# Patient Record
Sex: Female | Born: 1975 | Race: Black or African American | Hispanic: No | Marital: Married | Smoking: Never smoker
Health system: Southern US, Community
[De-identification: ages and names within clinical notes are randomized; demographics above are authoritative.]

## PROBLEM LIST (undated history)

## (undated) DIAGNOSIS — F419 Anxiety disorder, unspecified: Secondary | ICD-10-CM

## (undated) DIAGNOSIS — D219 Benign neoplasm of connective and other soft tissue, unspecified: Secondary | ICD-10-CM

## (undated) DIAGNOSIS — I10 Essential (primary) hypertension: Secondary | ICD-10-CM

## (undated) DIAGNOSIS — E7800 Pure hypercholesterolemia, unspecified: Secondary | ICD-10-CM

## (undated) DIAGNOSIS — F32A Depression, unspecified: Secondary | ICD-10-CM

## (undated) DIAGNOSIS — F329 Major depressive disorder, single episode, unspecified: Secondary | ICD-10-CM

## (undated) HISTORY — DX: Anxiety disorder, unspecified: F41.9

## (undated) HISTORY — DX: Pure hypercholesterolemia, unspecified: E78.00

## (undated) HISTORY — DX: Major depressive disorder, single episode, unspecified: F32.9

## (undated) HISTORY — DX: Depression, unspecified: F32.A

## (undated) HISTORY — PX: WISDOM TOOTH EXTRACTION: SHX21

## (undated) HISTORY — DX: Benign neoplasm of connective and other soft tissue, unspecified: D21.9

## (undated) HISTORY — DX: Essential (primary) hypertension: I10

---

## 1999-10-23 ENCOUNTER — Other Ambulatory Visit: Admission: RE | Admit: 1999-10-23 | Discharge: 1999-10-23 | Payer: Self-pay | Admitting: Gynecology

## 1999-11-21 ENCOUNTER — Other Ambulatory Visit: Admission: RE | Admit: 1999-11-21 | Discharge: 1999-11-21 | Payer: Self-pay | Admitting: Gynecology

## 2000-06-20 ENCOUNTER — Other Ambulatory Visit: Admission: RE | Admit: 2000-06-20 | Discharge: 2000-06-20 | Payer: Self-pay | Admitting: Gynecology

## 2000-11-05 ENCOUNTER — Other Ambulatory Visit: Admission: RE | Admit: 2000-11-05 | Discharge: 2000-11-05 | Payer: Self-pay | Admitting: Gynecology

## 2000-12-02 ENCOUNTER — Other Ambulatory Visit: Admission: RE | Admit: 2000-12-02 | Discharge: 2000-12-02 | Payer: Self-pay | Admitting: Gynecology

## 2001-07-03 ENCOUNTER — Encounter: Admission: RE | Admit: 2001-07-03 | Discharge: 2001-07-03 | Payer: Self-pay | Admitting: Family Medicine

## 2001-07-03 ENCOUNTER — Encounter: Payer: Self-pay | Admitting: Family Medicine

## 2001-07-08 ENCOUNTER — Encounter: Payer: Self-pay | Admitting: Family Medicine

## 2001-07-08 ENCOUNTER — Encounter: Admission: RE | Admit: 2001-07-08 | Discharge: 2001-07-08 | Payer: Self-pay | Admitting: Family Medicine

## 2001-08-08 ENCOUNTER — Encounter: Payer: Self-pay | Admitting: Urology

## 2001-08-08 ENCOUNTER — Encounter: Admission: RE | Admit: 2001-08-08 | Discharge: 2001-08-08 | Payer: Self-pay | Admitting: Urology

## 2001-11-27 ENCOUNTER — Other Ambulatory Visit: Admission: RE | Admit: 2001-11-27 | Discharge: 2001-11-27 | Payer: Self-pay | Admitting: Gynecology

## 2002-08-05 ENCOUNTER — Encounter: Payer: Self-pay | Admitting: Emergency Medicine

## 2002-08-05 ENCOUNTER — Emergency Department (HOSPITAL_COMMUNITY): Admission: EM | Admit: 2002-08-05 | Discharge: 2002-08-05 | Payer: Self-pay | Admitting: Emergency Medicine

## 2002-10-16 ENCOUNTER — Other Ambulatory Visit: Admission: RE | Admit: 2002-10-16 | Discharge: 2002-10-16 | Payer: Self-pay | Admitting: Gynecology

## 2004-07-05 ENCOUNTER — Emergency Department (HOSPITAL_COMMUNITY): Admission: EM | Admit: 2004-07-05 | Discharge: 2004-07-06 | Payer: Self-pay | Admitting: Emergency Medicine

## 2004-10-02 ENCOUNTER — Other Ambulatory Visit: Admission: RE | Admit: 2004-10-02 | Discharge: 2004-10-02 | Payer: Self-pay | Admitting: Gynecology

## 2007-09-25 ENCOUNTER — Other Ambulatory Visit: Admission: RE | Admit: 2007-09-25 | Discharge: 2007-09-25 | Payer: Self-pay | Admitting: Gynecology

## 2008-10-05 ENCOUNTER — Encounter: Payer: Self-pay | Admitting: Gynecology

## 2008-10-05 ENCOUNTER — Other Ambulatory Visit: Admission: RE | Admit: 2008-10-05 | Discharge: 2008-10-05 | Payer: Self-pay | Admitting: Gynecology

## 2008-10-05 ENCOUNTER — Ambulatory Visit: Payer: Self-pay | Admitting: Gynecology

## 2009-10-06 ENCOUNTER — Encounter: Payer: Self-pay | Admitting: Gynecology

## 2009-10-06 ENCOUNTER — Other Ambulatory Visit: Admission: RE | Admit: 2009-10-06 | Discharge: 2009-10-06 | Payer: Self-pay | Admitting: Gynecology

## 2009-10-06 ENCOUNTER — Ambulatory Visit: Payer: Self-pay | Admitting: Gynecology

## 2010-02-06 ENCOUNTER — Ambulatory Visit: Payer: Self-pay | Admitting: Gynecology

## 2010-02-17 ENCOUNTER — Ambulatory Visit: Payer: Self-pay | Admitting: Gynecology

## 2010-09-29 ENCOUNTER — Inpatient Hospital Stay (HOSPITAL_COMMUNITY): Admission: AD | Admit: 2010-09-29 | Discharge: 2010-09-30 | Payer: Self-pay | Admitting: Obstetrics and Gynecology

## 2010-10-10 ENCOUNTER — Inpatient Hospital Stay (HOSPITAL_COMMUNITY): Admission: AD | Admit: 2010-10-10 | Discharge: 2010-10-10 | Payer: Self-pay | Admitting: Obstetrics and Gynecology

## 2010-10-11 ENCOUNTER — Inpatient Hospital Stay (HOSPITAL_COMMUNITY): Admission: AD | Admit: 2010-10-11 | Discharge: 2010-10-13 | Payer: Self-pay | Admitting: Obstetrics and Gynecology

## 2010-10-13 ENCOUNTER — Encounter
Admission: RE | Admit: 2010-10-13 | Discharge: 2010-11-12 | Payer: Self-pay | Source: Home / Self Care | Admitting: Obstetrics and Gynecology

## 2010-11-13 ENCOUNTER — Encounter
Admission: RE | Admit: 2010-11-13 | Discharge: 2010-11-23 | Payer: Self-pay | Source: Home / Self Care | Attending: Obstetrics and Gynecology | Admitting: Obstetrics and Gynecology

## 2011-02-28 LAB — CBC
Hemoglobin: 10.3 g/dL — ABNORMAL LOW (ref 12.0–15.0)
Hemoglobin: 12 g/dL (ref 12.0–15.0)
MCH: 31.1 pg (ref 26.0–34.0)
MCHC: 34.3 g/dL (ref 30.0–36.0)
MCHC: 34.4 g/dL (ref 30.0–36.0)
MCV: 90.6 fL (ref 78.0–100.0)
RBC: 3.3 MIL/uL — ABNORMAL LOW (ref 3.87–5.11)
RDW: 14 % (ref 11.5–15.5)
WBC: 15.2 10*3/uL — ABNORMAL HIGH (ref 4.0–10.5)
WBC: 16 10*3/uL — ABNORMAL HIGH (ref 4.0–10.5)

## 2011-02-28 LAB — RPR: RPR Ser Ql: NONREACTIVE

## 2012-02-13 ENCOUNTER — Encounter (HOSPITAL_COMMUNITY): Payer: Self-pay | Admitting: Cardiology

## 2012-02-13 ENCOUNTER — Emergency Department (INDEPENDENT_AMBULATORY_CARE_PROVIDER_SITE_OTHER)
Admission: EM | Admit: 2012-02-13 | Discharge: 2012-02-13 | Disposition: A | Discharge status: Home Health | Payer: 59 | Source: Home / Self Care | Attending: Family Medicine | Admitting: Family Medicine

## 2012-02-13 DIAGNOSIS — H109 Unspecified conjunctivitis: Secondary | ICD-10-CM

## 2012-02-13 DIAGNOSIS — J069 Acute upper respiratory infection, unspecified: Secondary | ICD-10-CM

## 2012-02-13 MED ORDER — POLYMYXIN B-TRIMETHOPRIM 10000-0.1 UNIT/ML-% OP SOLN: 1.0000 [drp] | Freq: Four times a day (QID) | OPHTHALMIC | Status: AC

## 2012-02-13 NOTE — ED Provider Notes (Signed)
Tammy Escobar is a 36 y.o. female who presents to Urgent Care today for right eye conjunctivitis starting today. Patient has cough nasal congestion and rhinorrhea for the past 4 days. She denies any trouble breathing fevers or chills. She otherwise feels well. She has a 55-month-old daughter at home who is currently well.   PMH reviewed. Healthy young woman ROS as above otherwise neg Medications reviewed. No current facility-administered medications for this encounter.   Current Outpatient Prescriptions  Medication Sig Dispense Refill  . trimethoprim-polymyxin b (POLYTRIM) ophthalmic solution Place 1 drop into the right eye every 6 (six) hours.  10 mL  0    Exam:  BP 128/88  Pulse 96  Temp(Src) 98.9 F (37.2 C) (Oral)  Resp 18  SpO2 100% Gen: Well NAD HEENT: EOMI,  MMM, right eye conjunctival injection. Normal tympanic membranes bilaterally. Posterior pharynx is erythematous without exudate Lungs: CTABL Nl WOB Heart: RRR no MRG Exts: Non edematous BL  LE, warm and well perfused.   Assessment and Plan: 36 year old woman with a viral URI with conjunctivitis. Plan to treat URI symptomatically with Tylenol. Conjunctivitis is likely also a viral, however we'll provide Polytrim eyedrops if it does not improve or develops significant discharge. I discussed warning signs such as but not limited to trouble with vision, eye pain, trouble breathing, chest pain, high fever with patient who expresses understanding.       Clementeen Graham, MD 02/13/12 [phone]

## 2012-02-13 NOTE — ED Notes (Signed)
Pt reports nasal congestion and redness to right eye that started earlier today. Pt states fever today no number to report but felt hot to touch and then became sweaty

## 2012-02-13 NOTE — Discharge Instructions (Signed)
Thank you for coming in today. You have a cold.  Use the eye drops if your eye does not get better in a few days or has discharge.  Look out for worsening vision, eye pain, trouble breathing or high fever. Go to the Emergency Room if you have those symptoms.   Conjunctivitis Conjunctivitis is commonly called "pink eye." Conjunctivitis can be caused by bacterial or viral infection, allergies, or injuries. There is usually redness of the lining of the eye, itching, discomfort, and sometimes discharge. There may be deposits of matter along the eyelids. A viral infection usually causes a watery discharge, while a bacterial infection causes a yellowish, thick discharge. Pink eye is very contagious and spreads by direct contact. You may be given antibiotic eyedrops as part of your treatment. Before using your eye medicine, remove all drainage from the eye by washing gently with warm water and cotton balls. Continue to use the medication until you have awakened 2 mornings in a row without discharge from the eye. Do not rub your eye. This increases the irritation and helps spread infection. Use separate towels from other household members. Wash your hands with soap and water before and after touching your eyes. Use cold compresses to reduce pain and sunglasses to relieve irritation from light. Do not wear contact lenses or wear eye makeup until the infection is gone. SEEK MEDICAL CARE IF:   Your symptoms are not better after 3 days of treatment.   You have increased pain or trouble seeing.   The outer eyelids become very red or swollen.  Document Released: 01/10/2005 Document Revised: 08/15/2011 Document Reviewed: 12/03/2005 [facility] Patient Information 2012 [place], [place].

## 2012-02-15 NOTE — ED Provider Notes (Signed)
Medical screening examination/treatment/procedure(s) were performed by resident physician or non-physician practitioner and as supervising physician I was immediately available for consultation/collaboration.   Declyn Offield DOUGLAS MD.    Siera Beyersdorf Douglas Dayon Witt, MD 02/15/12 1621 

## 2012-05-30 ENCOUNTER — Encounter: Payer: Self-pay | Admitting: *Deleted

## 2012-06-02 ENCOUNTER — Encounter: Payer: 59 | Admitting: Gynecology

## 2012-06-05 ENCOUNTER — Encounter: Payer: Self-pay | Admitting: Gynecology

## 2012-06-05 ENCOUNTER — Ambulatory Visit (INDEPENDENT_AMBULATORY_CARE_PROVIDER_SITE_OTHER): Payer: 59 | Admitting: Gynecology

## 2012-06-05 ENCOUNTER — Other Ambulatory Visit (HOSPITAL_COMMUNITY)
Admission: RE | Admit: 2012-06-05 | Discharge: 2012-06-05 | Disposition: A | Discharge status: Home / Self Care | Payer: 59 | Source: Ambulatory Visit | Attending: Gynecology | Admitting: Gynecology

## 2012-06-05 VITALS — BP 124/82 | Ht 64.75 in | Wt 179.0 lb

## 2012-06-05 DIAGNOSIS — D259 Leiomyoma of uterus, unspecified: Secondary | ICD-10-CM

## 2012-06-05 DIAGNOSIS — Z01419 Encounter for gynecological examination (general) (routine) without abnormal findings: Secondary | ICD-10-CM

## 2012-06-05 DIAGNOSIS — N920 Excessive and frequent menstruation with regular cycle: Secondary | ICD-10-CM

## 2012-06-05 DIAGNOSIS — Z131 Encounter for screening for diabetes mellitus: Secondary | ICD-10-CM

## 2012-06-05 DIAGNOSIS — Z13220 Encounter for screening for lipoid disorders: Secondary | ICD-10-CM

## 2012-06-05 DIAGNOSIS — Z1159 Encounter for screening for other viral diseases: Secondary | ICD-10-CM | POA: Insufficient documentation

## 2012-06-05 LAB — CBC WITH DIFFERENTIAL/PLATELET
Lymphs Abs: 2.2 10*3/uL (ref 0.7–4.0)
MCHC: 33.9 g/dL (ref 30.0–36.0)
Monocytes Relative: 5 % (ref 3–12)
Neutro Abs: 6.3 10*3/uL (ref 1.7–7.7)
Platelets: 382 10*3/uL (ref 150–400)
RBC: 4.45 MIL/uL (ref 3.87–5.11)
WBC: 9 10*3/uL (ref 4.0–10.5)

## 2012-06-05 LAB — LIPID PANEL: LDL Cholesterol: 91 mg/dL (ref 0–99)

## 2012-06-05 MED ORDER — NORETHINDRONE ACET-ETHINYL EST 1-20 MG-MCG PO TABS: 1.0000 | ORAL_TABLET | Freq: Every day | ORAL | Status: DC

## 2012-06-05 NOTE — Progress Notes (Signed)
Tammy Escobar 13-Aug-1976 [id number]        36 y.o.  for annual exam.  Several issues noted below.  Past medical history,surgical history, medications, allergies, family history and social history were all reviewed and documented in the EPIC chart. ROS:  Was performed and pertinent positives and negatives are included in the history.  Exam: Elane Fritz chaperone present Filed Vitals:   06/05/12 1138  BP: 124/82   General appearance  Normal Skin grossly normal Head/Neck normal with no cervical or supraclavicular adenopathy thyroid normal Lungs  clear Cardiac RR, without RMG Abdominal  soft, nontender, without masses, organomegaly or hernia Breasts  examined lying and sitting without masses, retractions, discharge or axillary adenopathy. Pelvic  Ext/BUS/vagina  normal   Cervix  normal Pap/HPV  Uterus  anteverted, normal size, shape and contour, midline and mobile nontender   Adnexa  Without masses or tenderness    Anus and perineum  normal   Rectovaginal  normal sphincter tone without palpated masses or tenderness.    Assessment/Plan:  36 y.o. female for annual exam.    1. Menorrhagia.  Underwent vaginal delivery 2011. Menses have resumed and are heavy monthly periods requiring double protection. 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 Exam overall is grossly normal. Recommended sonohysterogram rule out intracavitary myomas. Baseline CBC/TSH. Patient will follow up for these studies. 2. Contraception. Reviewed all options of contraception. Thinking of pregnancy in 1-2 years. Is interested in trying low-dose oral contraceptives. I reviewed risks benefits to include thrombosis, stroke heart attack DVT. She does not smoke and is not being followed for any medical issues and accepts the risks. Loestrin 120 equivalents prescribed x1 year.  Sunday start backup contraception. 3. Breast health. Screening mammographic recommendations between 35 and 40 were reviewed with the patient. She does have  grandmothers with breast cancer, postmenopausal.  Patient is comfortable waiting to 40 after her next pregnancy which I think is reasonable. SBE monthly reviewed. 4. Pap smear. Pap/HPV done. No history of abnormal Pap smears with last Pap smear 2011. Assuming negative will plan every 5 year Pap smear per current screening guidelines. 5. Health maintenance. Baseline CBC lipid profile glucose urinalysis ordered. Patient will follow up for her sonohysterogram and lab studies and we'll go from there.    Dara Lords MD, 12:04 PM 06/05/2012

## 2012-06-05 NOTE — Patient Instructions (Signed)
Follow up for ultrasound study as scheduled. Start on oral contraceptives with next menses, backup contraception with condoms at least first pack.

## 2012-06-06 LAB — URINALYSIS W MICROSCOPIC + REFLEX CULTURE
Bacteria, UA: NONE SEEN
Bilirubin Urine: NEGATIVE
Glucose, UA: NEGATIVE mg/dL
Leukocytes, UA: NEGATIVE
Nitrite: NEGATIVE
Protein, ur: NEGATIVE mg/dL
Urobilinogen, UA: 0.2 mg/dL (ref 0.0–1.0)
pH: 6 (ref 5.0–8.0)

## 2012-06-16 ENCOUNTER — Ambulatory Visit (INDEPENDENT_AMBULATORY_CARE_PROVIDER_SITE_OTHER): Payer: 59

## 2012-06-16 ENCOUNTER — Encounter: Payer: Self-pay | Admitting: Gynecology

## 2012-06-16 ENCOUNTER — Other Ambulatory Visit: Payer: Self-pay | Admitting: Gynecology

## 2012-06-16 ENCOUNTER — Ambulatory Visit (INDEPENDENT_AMBULATORY_CARE_PROVIDER_SITE_OTHER): Payer: 59 | Admitting: Gynecology

## 2012-06-16 DIAGNOSIS — D251 Intramural leiomyoma of uterus: Secondary | ICD-10-CM

## 2012-06-16 DIAGNOSIS — N8300 Follicular cyst of ovary, unspecified side: Secondary | ICD-10-CM

## 2012-06-16 DIAGNOSIS — D259 Leiomyoma of uterus, unspecified: Secondary | ICD-10-CM

## 2012-06-16 DIAGNOSIS — N920 Excessive and frequent menstruation with regular cycle: Secondary | ICD-10-CM

## 2012-06-16 NOTE — Patient Instructions (Signed)
Office will call you with biopsy results. Start on oral contraceptives as discussed. Call if your periods continue heavy.

## 2012-06-16 NOTE — Progress Notes (Signed)
Patient presents for sonohysterogram due to menorrhagia. Ultrasound shows several small myomas largest 16 mm. Endometrial echo 2.6 mm. Right and left ovaries grossly normal with follicles. Cul-de-sac negative. Sonohysterogram performed, sterile technique, easy catheter introduction, good distention, no abnormalities. Endometrial sample taken. Patient tolerated well.  Assessment and plan: Menorrhagia with negative sonohysterogram. TSH normal, hemoglobin 12.9. She is going to start on low-dose oral contraceptives as discussed at her annual exam and we'll see how she does. Alternatives to include Mirena IUD discussed. She is still interested in childbearing. Patient will follow up if her menorrhagia continues despite BCPs. She will also follow up for her biopsy results.

## 2012-12-16 ENCOUNTER — Emergency Department (HOSPITAL_COMMUNITY)
Admission: EM | Admit: 2012-12-16 | Discharge: 2012-12-16 | Disposition: A | Discharge status: Home / Self Care | Payer: 59 | Source: Home / Self Care

## 2012-12-16 ENCOUNTER — Encounter (HOSPITAL_COMMUNITY): Payer: Self-pay | Admitting: *Deleted

## 2012-12-16 DIAGNOSIS — M25559 Pain in unspecified hip: Secondary | ICD-10-CM

## 2012-12-16 DIAGNOSIS — M25551 Pain in right hip: Secondary | ICD-10-CM

## 2012-12-16 MED ORDER — TRAMADOL HCL 50 MG PO TABS: 50.0000 mg | ORAL_TABLET | Freq: Four times a day (QID) | ORAL | Status: DC | PRN

## 2012-12-16 MED ORDER — KETOROLAC TROMETHAMINE 60 MG/2ML IM SOLN
60.0000 mg | Freq: Once | INTRAMUSCULAR | Status: AC
  Administered 2012-12-16: 60 mg via INTRAMUSCULAR

## 2012-12-16 MED ORDER — KETOROLAC TROMETHAMINE 60 MG/2ML IM SOLN
INTRAMUSCULAR | Status: AC
  Filled 2012-12-16: qty 2

## 2012-12-16 MED ORDER — METHYLPREDNISOLONE 4 MG PO KIT: PACK | ORAL | Status: DC

## 2012-12-16 NOTE — ED Provider Notes (Signed)
History     CSN: [id number]  Arrival date & time 12/16/12  1624   None     Chief Complaint  Patient presents with  . Back Pain    (Consider location/radiation/quality/duration/timing/severity/associated sxs/prior treatment) HPI Comments: Residual female presents with pain in the right posterior hip it radiates to the right mid thigh approximately 3 weeks. His been no history of injury or trauma. Is nothing that makes it worse nothing makes it better. It may occur at any time such as when getting out of bed, when walking the act of sitting and doing sitting. She points to her upper posterior buttock as the source of pain and tenderness in the lateral thigh which radiates. She has been taking Tylenol and Advil without relief. She denies focal paresthesias or weakness.   Past Medical History  Diagnosis Date  . Leiomyoma     Past Surgical History  Procedure Date  . Wisdom tooth extraction   . Vaginal delivery 2011    Family History  Problem Relation Age of Onset  . Heart disease Father   . Hypertension Father   . Breast cancer Maternal Grandmother   . Cancer Maternal Grandmother     BLADDER  . Breast cancer Paternal Grandmother   . Hypertension Mother   . Diabetes Maternal Aunt   . Hypertension Maternal Aunt   . Diabetes Maternal Uncle   . Hypertension Maternal Uncle   . Diabetes Paternal Aunt   . Hypertension Paternal Aunt   . Diabetes Paternal Uncle   . Hypertension Paternal Uncle     History  Substance Use Topics  . Smoking status: Never Smoker   . Smokeless tobacco: Never Used  . Alcohol Use: Yes     Comment: social    OB History    Grav Para Term Preterm Abortions TAB SAB Ect Mult Living   1 1        1       Review of Systems  Constitutional: Negative for fever, chills and activity change.  HENT: Negative.   Respiratory: Negative.   Cardiovascular: Negative.   Gastrointestinal: Negative.   Musculoskeletal:       As per HPI  Skin: Negative for  color change, pallor and rash.  Neurological: Negative.     Allergies  Review of patient's allergies indicates no known allergies.  Home Medications   Current Outpatient Rx  Name  Route  Sig  Dispense  Refill  . METHYLPREDNISOLONE 4 MG PO KIT      follow package directions   21 tablet   0   . NORETHINDRONE ACET-ETHINYL EST 1-20 MG-MCG PO TABS   Oral   Take 1 tablet by mouth daily.   1 Package   11   . M-VIT PO TABS   Oral   Take 1 tablet by mouth daily.         . TRAMADOL HCL 50 MG PO TABS   Oral   Take 1 tablet (50 mg total) by mouth every 6 (six) hours as needed for pain.   15 tablet   0     BP 124/81  Pulse 90  Temp 99 F (37.2 C) (Oral)  Resp 18  Wt 175 lb (79.379 kg)  SpO2 97%  LMP 11/27/2012  Physical Exam  Nursing note and vitals reviewed. Constitutional: She is oriented to person, place, and time. She appears well-developed and well-nourished. No distress.  HENT:  Head: Normocephalic and atraumatic.  Neck: Normal range of motion. Neck supple.  Pulmonary/Chest: Effort normal.  Musculoskeletal:       Tenderness in the upper posterior buttock musculature but not in the median to lower buttock. No tenderness in the thigh where she describes the radiation of pain. Distal neurovascular motor sensory is grossly intact. Muscle tone is normal  Lymphadenopathy:    She has no cervical adenopathy.  Neurological: She is alert and oriented to person, place, and time. No cranial nerve deficit.  Skin: Skin is warm and dry.  Psychiatric: She has a normal mood and affect.    ED Course  Procedures (including critical care time)  Labs Reviewed - No data to display No results found.   1. Acute right hip pain       MDM  Toradol 60 mg IM now Tomorrow start Medrol Dosepak Tramadol as directed for pain Apply heat as directed  Stretches and exercises as noted in the instructions page. Any to followup with PCP if pain is not improving and consideration of  physical therapy to be helpful .        Hayden Rasmussen, NP 12/16/12 [phone]

## 2012-12-16 NOTE — ED Notes (Signed)
Pt reports left side back pain that radiates down buttocks and leg - with no known injury

## 2012-12-23 NOTE — ED Provider Notes (Signed)
Medical screening examination/treatment/procedure(s) were performed by resident physician or non-physician practitioner and as supervising physician I was immediately available for consultation/collaboration.   Enrico Eaddy DOUGLAS MD.    Lakia Gritton D Rishab Stoudt, MD 12/23/12 1350 

## 2013-06-09 ENCOUNTER — Encounter: Payer: Self-pay | Admitting: Gynecology

## 2013-06-24 ENCOUNTER — Ambulatory Visit (INDEPENDENT_AMBULATORY_CARE_PROVIDER_SITE_OTHER): Payer: BC Managed Care – PPO | Admitting: Gynecology

## 2013-06-24 ENCOUNTER — Encounter: Payer: Self-pay | Admitting: Gynecology

## 2013-06-24 VITALS — BP 128/84 | Ht 65.0 in | Wt 172.0 lb

## 2013-06-24 DIAGNOSIS — D219 Benign neoplasm of connective and other soft tissue, unspecified: Secondary | ICD-10-CM

## 2013-06-24 DIAGNOSIS — Z01419 Encounter for gynecological examination (general) (routine) without abnormal findings: Secondary | ICD-10-CM

## 2013-06-24 MED ORDER — NORETHINDRONE ACET-ETHINYL EST 1-20 MG-MCG PO TABS: 1.0000 | ORAL_TABLET | Freq: Every day | ORAL | Status: DC

## 2013-06-24 NOTE — Patient Instructions (Signed)
Follow up in one year, sooner as needed. 

## 2013-06-24 NOTE — Progress Notes (Signed)
Tammy Escobar [birth year] [id number]        37 y.o.  [name] for annual exam.  Doing well without complaints.  Past medical history,surgical history, medications, allergies, family history and social history were all reviewed and documented in the EPIC chart.  ROS:  Performed and pertinent positives and negatives are included in the history, assessment and plan .  Exam: Sherrilyn Rist assistant Filed Vitals:   06/24/13 1102  BP: 128/84  Height: 5\' 5"  (1.651 m)  Weight: 172 lb (78.019 kg)   General appearance  Normal Skin grossly normal Head/Neck normal with no cervical or supraclavicular adenopathy thyroid normal Lungs  clear Cardiac RR, without RMG Abdominal  soft, nontender, without masses, organomegaly or hernia Breasts  examined lying and sitting without masses, retractions, discharge or axillary adenopathy. Pelvic  Ext/BUS/vagina  normal   Cervix  normal   Uterus  anteverted, normal size, shape and contour, midline and mobile nontender   Adnexa  Without masses or tenderness    Anus and perineum  normal   Rectovaginal  normal sphincter tone without palpated masses or tenderness.    Assessment/Plan:  37 y.o. [name] female for annual exam, regular menses, birth control pill contraception.   1. Birth control pills. Patient doing well with regular lighter menses. Wants to continue and I refilled her times a year with Loestrin 120 equivalents. 2. Leiomyoma. History of small leiomyoma. Exam is normal. Continue to monitor with annual exams. 3. Pap smear/HPV 05/2012. No Pap smear done today. No history of significant abnormalities. Plan repeat a 5 year interval. 4. Breast health. SBE monthly reviewed. Screening mammographic recommendations between 35 and 40 discussed. Patient has a strong family history of the first week closer to 40. 5. Health maintenance. No lab work done as this is all done through her primary physician's office. Followup in one year, sooner as needed.  Note: This document  was prepared with digital dictation and possible smart phrase technology. Any transcriptional errors that result from this process are unintentional.   Dara Lords MD, 11:15 AM 06/24/2013

## 2013-10-16 ENCOUNTER — Ambulatory Visit (INDEPENDENT_AMBULATORY_CARE_PROVIDER_SITE_OTHER): Payer: BC Managed Care – PPO | Admitting: Physician Assistant

## 2013-10-16 VITALS — BP 108/60 | HR 80 | Temp 98.0°F | Resp 18 | Wt 180.0 lb

## 2013-10-16 DIAGNOSIS — Z111 Encounter for screening for respiratory tuberculosis: Secondary | ICD-10-CM

## 2013-10-16 NOTE — Progress Notes (Signed)
  Subjective:    Patient ID: Tammy Escobar, female    DOB: 24-Feb-1976, 37 y.o.   MRN: [id number]  HPI  Presents for TB screening.  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   Tuberculosis Risk Questionnaire  1. No Were you born outside the [place] in one of the following parts of the world: [place], [place], [place], [place] or [place]?    2. No Have you traveled outside the [place] and lived for more than one month in one of the following parts of the world: [place], [place], [place], [place] or [place]?    3. No Do you have a compromised immune system such as from any of the following conditions:HIV/AIDS, organ or bone marrow transplantation, diabetes, immunosuppressive medicines (e.g. Prednisone, Remicaide), leukemia, lymphoma, cancer of the head or neck, gastrectomy or jejunal bypass, end-stage renal disease (on dialysis), or silicosis?     4. Yes  Have you ever or do you plan on working in: a residential care center, a health care facility, a jail or prison or homeless shelter? Works in health care     5. No Have you ever: injected illegal drugs, used crack cocaine, lived in a homeless shelter  or been in jail or prison?     6. No Have you ever been exposed to anyone with infectious tuberculosis?    Tuberculosis Symptom Questionnaire  Do you currently have any of the following symptoms?  1. No Unexplained cough lasting more than 3 weeks?   2. No Unexplained fever lasting more than 3 weeks.   3. No Night Sweats (sweating that leaves the bedclothes and sheets wet)     4. No Shortness of Breath   5. No Chest Pain   6. No Unintentional weight loss    7. No Unexplained fatigue (very tired for no reason)    Review of Systems  see symptoms questionnaire below.    Objective:   Physical Exam BP 108/60  Pulse 80  Temp(Src) 98 F (36.7 C) (Oral)  Resp 18  Wt 180 lb (81.647 kg)  BMI  29.95 kg/m2  SpO2 100%  LMP 10/06/2013  WDWNBF, A&O x 3. Normal respiratory effort. Skin is warm and dry. Mood, affect and behavior are appropriate.     Assessment & Plan:  Screening-pulmonary TB - Plan: TB Skin Test  RTC 48-72 hours for PPD reading  Fernande Bras, PA-C Physician Assistant-Certified [facility]

## 2013-10-16 NOTE — Patient Instructions (Signed)
Do not apply a bandaid to the site.

## 2013-10-18 ENCOUNTER — Encounter (INDEPENDENT_AMBULATORY_CARE_PROVIDER_SITE_OTHER): Payer: BC Managed Care – PPO

## 2013-10-18 DIAGNOSIS — Z111 Encounter for screening for respiratory tuberculosis: Secondary | ICD-10-CM

## 2013-10-18 LAB — TB SKIN TEST
Induration: 0 mm
TB Skin Test: NEGATIVE

## 2013-11-13 ENCOUNTER — Ambulatory Visit (INDEPENDENT_AMBULATORY_CARE_PROVIDER_SITE_OTHER): Payer: BC Managed Care – PPO | Admitting: Emergency Medicine

## 2013-11-13 VITALS — BP 116/68 | HR 108 | Temp 99.3°F | Resp 17 | Ht 65.5 in | Wt 178.0 lb

## 2013-11-13 DIAGNOSIS — J209 Acute bronchitis, unspecified: Secondary | ICD-10-CM

## 2013-11-13 MED ORDER — ALBUTEROL SULFATE HFA 108 (90 BASE) MCG/ACT IN AERS: 2.0000 | INHALATION_SPRAY | RESPIRATORY_TRACT | Status: DC | PRN

## 2013-11-13 MED ORDER — HYDROCOD POLST-CHLORPHEN POLST 10-8 MG/5ML PO LQCR: 5.0000 mL | Freq: Two times a day (BID) | ORAL | Status: DC | PRN

## 2013-11-13 NOTE — Progress Notes (Signed)
[facility] [address], [place] [address] [phone]  Date:  11/13/2013   Name:  Tammy Escobar   DOB:  1976/11/11   MRN:  [id number]  PCP:  Altamese Bagdad, MD    Chief Complaint: Cough, URI and Nasal Congestion   History of Present Illness:  Tammy Escobar is a 36 y.o. very pleasant female patient who presents with the following:  1 week history nasal congestion, post nasal drainage and a cough that is not productive.  No wheezing or shortness of breath.  Had a fever of 100.9 on Monday.  Cough is persistent and unrelenting.  Worse at night.  No nausea or vomiting.  No improvement with over the counter medications or other home remedies. Denies other complaint or health concern today.   There are no active problems to display for this patient.   Past Medical History  Diagnosis Date  . Leiomyoma     Past Surgical History  Procedure Laterality Date  . Wisdom tooth extraction    . Vaginal delivery  2011    History  Substance Use Topics  . Smoking status: Never Smoker   . Smokeless tobacco: Never Used  . Alcohol Use: Yes     Comment: social    Family History  Problem Relation Age of Onset  . Heart disease Father   . Hypertension Father   . Cancer Father 44    prostate  . Breast cancer Maternal Grandmother   . Cancer Maternal Grandmother     BLADDER  . Breast cancer Paternal Grandmother   . Hypertension Mother   . Diabetes Maternal Aunt   . Hypertension Maternal Aunt   . Diabetes Maternal Uncle   . Hypertension Maternal Uncle   . Diabetes Paternal Aunt   . Hypertension Paternal Aunt   . Diabetes Paternal Uncle   . Hypertension Paternal Uncle     No Known Allergies  Medication list has been reviewed and updated.  Current Outpatient Prescriptions on File Prior to Visit  Medication Sig Dispense Refill  . Multiple Vitamins-Minerals (MULTIVITAMIN WITH MINERALS) tablet Take 1 tablet by mouth daily.       No current  facility-administered medications on file prior to visit.    Review of Systems:  As per HPI, otherwise negative.    Physical Examination: Filed Vitals:   11/13/13 1313  BP: 116/68  Pulse: 108  Temp: 99.3 F (37.4 C)  Resp: 17   Filed Vitals:   11/13/13 1313  Height: 5' 5.5" (1.664 m)  Weight: 178 lb (80.74 kg)   Body mass index is 29.16 kg/(m^2). Ideal Body Weight: Weight in (lb) to have BMI = 25: 152.2  GEN: WDWN, NAD, Non-toxic, A & O x 3 HEENT: Atraumatic, Normocephalic. Neck supple. No masses, No LAD. Ears and Nose: No external deformity. CV: RRR, No M/G/R. No JVD. No thrill. No extra heart sounds. PULM: CTA B,  Scattered post tussive wheezes,no  crackles, rhonchi. No retractions. No resp. distress. No accessory muscle use. ABD: S, NT, ND, +BS. No rebound. No HSM. EXTR: No c/c/e NEURO Normal gait.  PSYCH: Normally interactive. Conversant. Not depressed or anxious appearing.  Calm demeanor.    Assessment and Plan: Bronchitis with bronchospasm proair tussionex mucinex  Signed,  Phillips Odor, MD

## 2013-11-13 NOTE — Patient Instructions (Signed)
Metered Dose Inhaler (No Spacer Used) Inhaled medicines are the basis of asthma treatment and other breathing problems. Inhaled medicine can only be effective if used properly. Good technique assures that the medicine reaches the lungs. Metered dose inhalers (MDIs) are used to deliver a variety of inhaled medicines. These include quick relief or rescue medicines (such as bronchodilators) and controller medicines (such as corticosteroids). The medicine is delivered by pushing down on a metal canister to release a set amount of spray. If you are using different kinds of inhalers, use your quick relief medicine to open the airways 10 15 minutes before using a steroid if instructed to do so by your health care provider. If you are unsure which inhalers to use and the order of using them, ask your health care provider, nurse, or respiratory therapist. HOW TO USE THE INHALER 1. Remove cap from inhaler. 2. If you are using the inhaler for the first time, you will need to prime it. Shake the inhaler for 5 seconds and release four puffs into the air, away from your face. Ask your health care provider or pharmacist if you have questions about priming your inhaler. 3. Shake inhaler for 5 seconds before each breath in (inhalation). 4. Position the inhaler so that the top of the canister faces up. 5. Put your index finger on the top of the medicine canister. Your thumb supports the bottom of the inhaler. 6. Open your mouth. 7. Either place the inhaler between your teeth and place your lips tightly around the mouthpiece, or hold the inhaler 1 2 inches away from your open mouth. If you are unsure of which technique to use, ask your health care provider. 8. Breathe out (exhale) normally and as completely as possible. 9. Press the canister down with the index finger to release the medicine. 10. At the same time as the canister is pressed, inhale deeply and slowly until the lungs are completely filled. This should take  4 6 seconds. Keep your tongue down. 11. Hold the medicine in your lungs for up to 5 10 seconds (10 seconds is best). This helps the medicine get into the small airways of your lungs. 12. Breathe out slowly, through pursed lips. Whistling is an example of pursed lips. 13. Wait at least 1 minute between puffs. Continue with the above steps until you have taken the number of puffs your health care provider has ordered. Do not use the inhaler more than your health care provider directs you to. 14. Replace cap on inhaler. 15. Follow the directions from your health care provider or the inhaler insert for cleaning the inhaler. If you are using a steroid inhaler, rinse your mouth with water after your last puff, gargle, and spit out the water. Do not swallow the water. AVOID:  Inhaling before or after starting the spray of medicine. It takes practice to coordinate your breathing with triggering the spray.  Inhaling through the nose (rather than the mouth) when triggering the spray. HOW TO DETERMINE IF YOUR INHALER IS FULL OR NEARLY EMPTY You cannot know when an inhaler is empty by shaking it. A few inhalers are now being made with dose counters. Ask your health care provider for a prescription that has a dose counter if you feel you need that extra help. If your inhaler does not have a counter, ask your health care provider to help you determine the date you need to refill your inhaler. Write the refill date on a calendar or your inhaler canister.   Refill your inhaler 7 10 days before it runs out. Be sure to keep an adequate supply of medicine. This includes making sure it is not expired, and you have a spare inhaler.  SEEK MEDICAL CARE IF:   Symptoms are only partially relieved with your inhaler.  You are having trouble using your inhaler.  You experience some increase in phlegm. SEEK IMMEDIATE MEDICAL CARE IF:   You feel little or no relief with your inhalers. You are still wheezing and are feeling  shortness of breath or tightness in your chest or both.  You have dizziness, headaches, or fast heart rate.  You have chills, fever, or night sweats.  There is a noticeable increase in phlegm production, or there is blood in the phlegm. Document Released: 09/30/2007 Document Revised: 08/05/2013 Document Reviewed: 05/21/2013 [employer] Patient Information 2014 [employer].  

## 2014-07-05 ENCOUNTER — Ambulatory Visit (INDEPENDENT_AMBULATORY_CARE_PROVIDER_SITE_OTHER): Payer: 59 | Admitting: Gynecology

## 2014-07-05 ENCOUNTER — Encounter: Payer: Self-pay | Admitting: Gynecology

## 2014-07-05 VITALS — BP 118/72 | Ht 65.0 in | Wt 184.6 lb

## 2014-07-05 DIAGNOSIS — Z01419 Encounter for gynecological examination (general) (routine) without abnormal findings: Secondary | ICD-10-CM

## 2014-07-05 LAB — CBC WITH DIFFERENTIAL/PLATELET
BASOS ABS: 0 10*3/uL (ref 0.0–0.1)
BASOS PCT: 0 % (ref 0–1)
Eosinophils Absolute: 0.1 10*3/uL (ref 0.0–0.7)
Eosinophils Relative: 1 % (ref 0–5)
HEMATOCRIT: 37.2 % (ref 36.0–46.0)
Hemoglobin: 12.7 g/dL (ref 12.0–15.0)
Lymphocytes Relative: 18 % (ref 12–46)
Lymphs Abs: 1.1 10*3/uL (ref 0.7–4.0)
MCH: 28.5 pg (ref 26.0–34.0)
MCHC: 34.1 g/dL (ref 30.0–36.0)
MCV: 83.4 fL (ref 78.0–100.0)
MONO ABS: 0.3 10*3/uL (ref 0.1–1.0)
MONOS PCT: 5 % (ref 3–12)
NEUTROS ABS: 4.8 10*3/uL (ref 1.7–7.7)
NEUTROS PCT: 76 % (ref 43–77)
PLATELETS: 355 10*3/uL (ref 150–400)
RBC: 4.46 MIL/uL (ref 3.87–5.11)
RDW: 13.5 % (ref 11.5–15.5)
WBC: 6.3 10*3/uL (ref 4.0–10.5)

## 2014-07-05 MED ORDER — NORETHINDRONE ACET-ETHINYL EST 1-20 MG-MCG PO TABS: 1.0000 | ORAL_TABLET | Freq: Every day | ORAL | Status: DC

## 2014-07-05 NOTE — Progress Notes (Signed)
Tammy Escobar 1976/10/14 [id number]        38 y.o.  [name] for annual exam.  Several issues noted below.  Past medical history,surgical history, problem list, medications, allergies, family history and social history were all reviewed and documented as reviewed in the EPIC chart.  ROS:  12 system ROS performed with pertinent positives and negatives included in the history, assessment and plan.   Additional significant findings :  None   Exam: Journalist, newspaper Filed Vitals:   07/05/14 1520  BP: 118/72  Height: 5\' 5"  (1.651 m)  Weight: 184 lb 9.6 oz (83.734 kg)   General appearance:  Normal affect, orientation and appearance. Skin: Grossly normal HEENT: Without gross lesions.  No cervical or supraclavicular adenopathy. Thyroid normal.  Lungs:  Clear without wheezing, rales or rhonchi Cardiac: RR, without RMG Abdominal:  Soft, nontender, without masses, guarding, rebound, organomegaly or hernia Breasts:  Examined lying and sitting without masses, retractions, discharge or axillary adenopathy. Pelvic:  Ext/BUS/vagina normal  Cervix normal  Uterus anteverted, normal size, shape and contour, midline and mobile nontender   Adnexa  Without masses or tenderness    Anus and perineum  Normal   Rectovaginal  Normal sphincter tone without palpated masses or tenderness.    Assessment/Plan:  38 y.o. [name] female for annual exam with regular menses, oral contraceptives.   1. Contraceptive management. Patient on Loestrin 120 equivalent and doing well wants to continue. Risks to include increased risk of stroke heart attack DVT reviewed. Does not smoke and is not being followed for any medical issues. Understands accepts risks and I refilled her x1 year. 2. One day history of headache and nausea. No real vomiting. Able to take by mouth fluids. No diarrhea constipation or abdominal pain. No dysuria frequency urgency. No else in the family sick. No skipped menses. LMP 06/26/2014. We'll check  baseline labs for CBC comp and some metabolic panel urinalysis. Follow symptoms at present. Probably viral syndrome. Assuming results and follow. Treat with pushing fluids and OTC ibuprofen 600-800 mg. If symptoms continue will followup for further evaluation. 3. Breast health. Screening mammographic recommendations between 47 and 40 reviewed. No strong family history and at this point plans to wait till 44. SBE monthly reviewed. 4. Pap smear/HPV negative 2013. No Pap smear done today. No history of significant abnormal Pap smears. Current screening recommendations for five-year interval repeat reviewed. Patient's a little uncomfortable with this and would prefer repeating it next year at a 3 year interval. Will rediscuss on an annual basis. 5. Health maintenance. Baseline CBC comprehensive metabolic panel lipid profile urinalysis done. Followup in one year, sooner if above symptoms continue or worsen.   Note: This document was prepared with digital dictation and possible smart phrase technology. Any transcriptional errors that result from this process are unintentional.   Anastasio Auerbach MD, 3:49 PM 07/05/2014

## 2014-07-05 NOTE — Patient Instructions (Signed)
Call if your headaches or nausea continue. Followup in one year for your annual exam.  You may obtain a copy of any labs that were done today by logging onto MyChart as outlined in the instructions provided with your AVS (after visit summary). The office will not call with normal lab results but certainly if there are any significant abnormalities then we will contact you.   Health Maintenance, Female A healthy lifestyle and preventative care can promote health and wellness.  Maintain regular health, dental, and eye exams.  Eat a healthy diet. Foods like vegetables, fruits, whole grains, low-fat dairy products, and lean protein foods contain the nutrients you need without too many calories. Decrease your intake of foods high in solid fats, added sugars, and salt. Get information about a proper diet from your caregiver, if necessary.  Regular physical exercise is one of the most important things you can do for your health. Most adults should get at least 150 minutes of moderate-intensity exercise (any activity that increases your heart rate and causes you to sweat) each week. In addition, most adults need muscle-strengthening exercises on 2 or more days a week.   Maintain a healthy weight. The body mass index (BMI) is a screening tool to identify possible weight problems. It provides an estimate of body fat based on height and weight. Your caregiver can help determine your BMI, and can help you achieve or maintain a healthy weight. For adults 20 years and older:  A BMI below 18.5 is considered underweight.  A BMI of 18.5 to 24.9 is normal.  A BMI of 25 to 29.9 is considered overweight.  A BMI of 30 and above is considered obese.  Maintain normal blood lipids and cholesterol by exercising and minimizing your intake of saturated fat. Eat a balanced diet with plenty of fruits and vegetables. Blood tests for lipids and cholesterol should begin at age 29 and be repeated every 5 years. If your  lipid or cholesterol levels are high, you are over 50, or you are a high risk for heart disease, you may need your cholesterol levels checked more frequently.Ongoing high lipid and cholesterol levels should be treated with medicines if diet and exercise are not effective.  If you smoke, find out from your caregiver how to quit. If you do not use tobacco, do not start.  Lung cancer screening is recommended for adults aged 30 80 years who are at high risk for developing lung cancer because of a history of smoking. Yearly low-dose computed tomography (CT) is recommended for people who have at least a 30-pack-year history of smoking and are a current smoker or have quit within the past 15 years. A pack year of smoking is smoking an average of 1 pack of cigarettes a day for 1 year (for example: 1 pack a day for 30 years or 2 packs a day for 15 years). Yearly screening should continue until the smoker has stopped smoking for at least 15 years. Yearly screening should also be stopped for people who develop a health problem that would prevent them from having lung cancer treatment.  If you are pregnant, do not drink alcohol. If you are breastfeeding, be very cautious about drinking alcohol. If you are not pregnant and choose to drink alcohol, do not exceed 1 drink per day. One drink is considered to be 12 ounces (355 mL) of beer, 5 ounces (148 mL) of wine, or 1.5 ounces (44 mL) of liquor.  Avoid use of street drugs. Do  not share needles with anyone. Ask for help if you need support or instructions about stopping the use of drugs.  High blood pressure causes heart disease and increases the risk of stroke. Blood pressure should be checked at least every 1 to 2 years. Ongoing high blood pressure should be treated with medicines, if weight loss and exercise are not effective.  If you are 62 to 38 years old, ask your caregiver if you should take aspirin to prevent strokes.  Diabetes screening involves taking a  blood sample to check your fasting blood sugar level. This should be done once every 3 years, after age 68, if you are within normal weight and without risk factors for diabetes. Testing should be considered at a younger age or be carried out more frequently if you are overweight and have at least 1 risk factor for diabetes.  Breast cancer screening is essential preventative care for women. You should practice "breast self-awareness." This means understanding the normal appearance and feel of your breasts and may include breast self-examination. Any changes detected, no matter how small, should be reported to a caregiver. Women in their 79s and 30s should have a clinical breast exam (CBE) by a caregiver as part of a regular health exam every 1 to 3 years. After age 32, women should have a CBE every year. Starting at age 78, women should consider having a mammogram (breast X-ray) every year. Women who have a family history of breast cancer should talk to their caregiver about genetic screening. Women at a high risk of breast cancer should talk to their caregiver about having an MRI and a mammogram every year.  Breast cancer gene (BRCA)-related cancer risk assessment is recommended for women who have family members with BRCA-related cancers. BRCA-related cancers include breast, ovarian, tubal, and peritoneal cancers. Having family members with these cancers may be associated with an increased risk for harmful changes (mutations) in the breast cancer genes BRCA1 and BRCA2. Results of the assessment will determine the need for genetic counseling and BRCA1 and BRCA2 testing.  The Pap test is a screening test for cervical cancer. Women should have a Pap test starting at age 63. Between ages 17 and 63, Pap tests should be repeated every 2 years. Beginning at age 65, you should have a Pap test every 3 years as long as the past 3 Pap tests have been normal. If you had a hysterectomy for a problem that was not cancer or  a condition that could lead to cancer, then you no longer need Pap tests. If you are between ages 61 and 33, and you have had normal Pap tests going back 10 years, you no longer need Pap tests. If you have had past treatment for cervical cancer or a condition that could lead to cancer, you need Pap tests and screening for cancer for at least 20 years after your treatment. If Pap tests have been discontinued, risk factors (such as a new sexual partner) need to be reassessed to determine if screening should be resumed. Some women have medical problems that increase the chance of getting cervical cancer. In these cases, your caregiver may recommend more frequent screening and Pap tests.  The human papillomavirus (HPV) test is an additional test that may be used for cervical cancer screening. The HPV test looks for the virus that can cause the cell changes on the cervix. The cells collected during the Pap test can be tested for HPV. The HPV test could be used to  screen women aged 23 years and older, and should be used in women of any age who have unclear Pap test results. After the age of 54, women should have HPV testing at the same frequency as a Pap test.  Colorectal cancer can be detected and often prevented. Most routine colorectal cancer screening begins at the age of 70 and continues through age 42. However, your caregiver may recommend screening at an earlier age if you have risk factors for colon cancer. On a yearly basis, your caregiver may provide home test kits to check for hidden blood in the stool. Use of a small camera at the end of a tube, to directly examine the colon (sigmoidoscopy or colonoscopy), can detect the earliest forms of colorectal cancer. Talk to your caregiver about this at age 41, when routine screening begins. Direct examination of the colon should be repeated every 5 to 10 years through age 51, unless early forms of pre-cancerous polyps or small growths are found.  Hepatitis C  blood testing is recommended for all people born from [birth year] through 1965 and any individual with known risks for hepatitis C.  Practice safe sex. Use condoms and avoid high-risk sexual practices to reduce the spread of sexually transmitted infections (STIs). Sexually active women aged 84 and younger should be checked for Chlamydia, which is a common sexually transmitted infection. Older women with new or multiple partners should also be tested for Chlamydia. Testing for other STIs is recommended if you are sexually active and at increased risk.  Osteoporosis is a disease in which the bones lose minerals and strength with aging. This can result in serious bone fractures. The risk of osteoporosis can be identified using a bone density scan. Women ages 63 and over and women at risk for fractures or osteoporosis should discuss screening with their caregivers. Ask your caregiver whether you should be taking a calcium supplement or vitamin D to reduce the rate of osteoporosis.  Menopause can be associated with physical symptoms and risks. Hormone replacement therapy is available to decrease symptoms and risks. You should talk to your caregiver about whether hormone replacement therapy is right for you.  Use sunscreen. Apply sunscreen liberally and repeatedly throughout the day. You should seek shade when your shadow is shorter than you. Protect yourself by wearing long sleeves, pants, a wide-brimmed hat, and sunglasses year round, whenever you are outdoors.  Notify your caregiver of new moles or changes in moles, especially if there is a change in shape or color. Also notify your caregiver if a mole is larger than the size of a pencil eraser.  Stay current with your immunizations. Document Released: 06/18/2011 Document Revised: 03/30/2013 Document Reviewed: 06/18/2011 [facility] Patient Information 2014 [place].

## 2014-07-06 LAB — URINALYSIS W MICROSCOPIC + REFLEX CULTURE
Bilirubin Urine: NEGATIVE
Casts: NONE SEEN
Crystals: NONE SEEN
Glucose, UA: NEGATIVE mg/dL
Hgb urine dipstick: NEGATIVE
Ketones, ur: NEGATIVE mg/dL
Nitrite: NEGATIVE
Protein, ur: NEGATIVE mg/dL
Specific Gravity, Urine: 1.014 (ref 1.005–1.030)
Urobilinogen, UA: 0.2 mg/dL (ref 0.0–1.0)
pH: 6 (ref 5.0–8.0)

## 2014-07-06 LAB — COMPREHENSIVE METABOLIC PANEL
ALBUMIN: 3.9 g/dL (ref 3.5–5.2)
ALT: 19 U/L (ref 0–35)
AST: 24 U/L (ref 0–37)
Alkaline Phosphatase: 38 U/L — ABNORMAL LOW (ref 39–117)
BILIRUBIN TOTAL: 1 mg/dL (ref 0.2–1.2)
BUN: 10 mg/dL (ref 6–23)
CHLORIDE: 101 meq/L (ref 96–112)
CO2: 26 mEq/L (ref 19–32)
Calcium: 8.4 mg/dL (ref 8.4–10.5)
Creat: 0.79 mg/dL (ref 0.50–1.10)
Glucose, Bld: 84 mg/dL (ref 70–99)
Potassium: 3.6 mEq/L (ref 3.5–5.3)
SODIUM: 137 meq/L (ref 135–145)
Total Protein: 7 g/dL (ref 6.0–8.3)

## 2014-07-06 LAB — LIPID PANEL
Cholesterol: 151 mg/dL (ref 0–200)
HDL: 42 mg/dL (ref >39)
LDL Cholesterol: 88 mg/dL (ref 0–99)
Total CHOL/HDL Ratio: 3.6 Ratio
Triglycerides: 104 mg/dL (ref <150)
VLDL: 21 mg/dL (ref 0–40)

## 2014-07-07 ENCOUNTER — Encounter (HOSPITAL_COMMUNITY): Payer: Self-pay | Admitting: Emergency Medicine

## 2014-07-07 DIAGNOSIS — Z8742 Personal history of other diseases of the female genital tract: Secondary | ICD-10-CM | POA: Insufficient documentation

## 2014-07-07 DIAGNOSIS — R197 Diarrhea, unspecified: Secondary | ICD-10-CM | POA: Insufficient documentation

## 2014-07-07 DIAGNOSIS — E876 Hypokalemia: Secondary | ICD-10-CM | POA: Insufficient documentation

## 2014-07-07 DIAGNOSIS — Z79899 Other long term (current) drug therapy: Secondary | ICD-10-CM | POA: Insufficient documentation

## 2014-07-07 LAB — URINE CULTURE
Colony Count: NO GROWTH
Organism ID, Bacteria: NO GROWTH

## 2014-07-07 NOTE — ED Notes (Signed)
Pt. reports multiple episodes of diarrhea onset 4 pm this afternoon with occasional nausea , denies fever or chills.

## 2014-07-08 ENCOUNTER — Emergency Department (HOSPITAL_COMMUNITY)
Admission: EM | Admit: 2014-07-08 | Discharge: 2014-07-08 | Disposition: A | Discharge status: Home / Self Care | Payer: 59 | Attending: Emergency Medicine | Admitting: Emergency Medicine

## 2014-07-08 DIAGNOSIS — E876 Hypokalemia: Secondary | ICD-10-CM

## 2014-07-08 DIAGNOSIS — R197 Diarrhea, unspecified: Secondary | ICD-10-CM

## 2014-07-08 LAB — COMPREHENSIVE METABOLIC PANEL
ALBUMIN: 3.8 g/dL (ref 3.5–5.2)
ALK PHOS: 45 U/L (ref 39–117)
ALT: 24 U/L (ref 0–35)
ANION GAP: 13 (ref 5–15)
AST: 27 U/L (ref 0–37)
BUN: 11 mg/dL (ref 6–23)
CALCIUM: 8.5 mg/dL (ref 8.4–10.5)
CO2: 24 meq/L (ref 19–32)
Chloride: 101 mEq/L (ref 96–112)
Creatinine, Ser: 0.85 mg/dL (ref 0.50–1.10)
GFR calc non Af Amer: 86 mL/min — ABNORMAL LOW (ref >90)
Glucose, Bld: 90 mg/dL (ref 70–99)
Potassium: 3.2 mEq/L — ABNORMAL LOW (ref 3.7–5.3)
Sodium: 138 mEq/L (ref 137–147)
Total Bilirubin: 1 mg/dL (ref 0.3–1.2)
Total Protein: 7.7 g/dL (ref 6.0–8.3)

## 2014-07-08 LAB — CBC WITH DIFFERENTIAL/PLATELET
BASOS PCT: 0 % (ref 0–1)
Basophils Absolute: 0 10*3/uL (ref 0.0–0.1)
Eosinophils Absolute: 0.1 10*3/uL (ref 0.0–0.7)
Eosinophils Relative: 1 % (ref 0–5)
HCT: 35.4 % — ABNORMAL LOW (ref 36.0–46.0)
HEMOGLOBIN: 11.9 g/dL — AB (ref 12.0–15.0)
Lymphocytes Relative: 32 % (ref 12–46)
Lymphs Abs: 2.7 10*3/uL (ref 0.7–4.0)
MCH: 28.7 pg (ref 26.0–34.0)
MCHC: 33.6 g/dL (ref 30.0–36.0)
MCV: 85.5 fL (ref 78.0–100.0)
MONO ABS: 0.5 10*3/uL (ref 0.1–1.0)
MONOS PCT: 6 % (ref 3–12)
NEUTROS ABS: 5 10*3/uL (ref 1.7–7.7)
Neutrophils Relative %: 61 % (ref 43–77)
PLATELETS: 349 10*3/uL (ref 150–400)
RBC: 4.14 MIL/uL (ref 3.87–5.11)
RDW: 12.5 % (ref 11.5–15.5)
WBC: 8.3 10*3/uL (ref 4.0–10.5)

## 2014-07-08 MED ORDER — ONDANSETRON 4 MG PO TBDP: ORAL_TABLET | ORAL | Status: DC

## 2014-07-08 MED ORDER — POTASSIUM CHLORIDE CRYS ER 20 MEQ PO TBCR
40.0000 meq | EXTENDED_RELEASE_TABLET | Freq: Once | ORAL | Status: AC
  Administered 2014-07-08: 40 meq via ORAL
  Filled 2014-07-08: qty 2

## 2014-07-08 MED ORDER — SODIUM CHLORIDE 0.9 % IV BOLUS (SEPSIS)
2000.0000 mL | Freq: Once | INTRAVENOUS | Status: AC
  Administered 2014-07-08: 2000 mL via INTRAVENOUS

## 2014-07-08 MED ORDER — ONDANSETRON HCL 4 MG/2ML IJ SOLN
4.0000 mg | Freq: Once | INTRAMUSCULAR | Status: AC
  Administered 2014-07-08: 4 mg via INTRAVENOUS
  Filled 2014-07-08: qty 2

## 2014-07-08 NOTE — ED Notes (Signed)
Pt ambulatory to restroom unassisted with steady gait

## 2014-07-08 NOTE — ED Provider Notes (Signed)
CSN: [id number]     Arrival date & time 07/07/14  2333 History   First MD Initiated Contact with Patient 07/08/14 0123     Chief Complaint  Patient presents with  . Diarrhea     (Consider location/radiation/quality/duration/timing/severity/associated sxs/prior Treatment) HPI Patient began having multiple episodes of watery diarrhea at 1600 on 7/22. She states she's had roughly 30 episodes of diarrhea. She has associated nausea without vomiting. She denies any abdominal pain or cramping. She denies any fevers or chills. She's had no sick contacts. Denies any recent international travel. She's not been eating due to lack of appetite but she states she has been drinking multiple Gatorades. Past Medical History  Diagnosis Date  . Leiomyoma    Past Surgical History  Procedure Laterality Date  . Wisdom tooth extraction    . Vaginal delivery  2011   Family History  Problem Relation Age of Onset  . Heart disease Father   . Hypertension Father   . Cancer Father 76    prostate  . Breast cancer Maternal Grandmother   . Cancer Maternal Grandmother     BLADDER  . Breast cancer Paternal Grandmother   . Hypertension Mother   . Diabetes Maternal Aunt   . Hypertension Maternal Aunt   . Diabetes Maternal Uncle   . Hypertension Maternal Uncle   . Diabetes Paternal Aunt   . Hypertension Paternal Aunt   . Diabetes Paternal Uncle   . Hypertension Paternal Uncle    History  Substance Use Topics  . Smoking status: Never Smoker   . Smokeless tobacco: Never Used  . Alcohol Use: Yes     Comment: social   OB History   Grav Para Term Preterm Abortions TAB SAB Ect Mult Living   1 1        1      Review of Systems  Constitutional: Positive for fatigue. Negative for fever and chills.  Respiratory: Negative for cough and shortness of breath.   Cardiovascular: Negative for chest pain, palpitations and leg swelling.  Gastrointestinal: Positive for nausea and diarrhea. Negative for vomiting,  abdominal pain and constipation.  Genitourinary: Negative for dysuria, flank pain and difficulty urinating.  Musculoskeletal: Negative for back pain, myalgias, neck pain and neck stiffness.  Skin: Negative for rash and wound.  Neurological: Negative for dizziness, weakness, light-headedness, numbness and headaches.  All other systems reviewed and are negative.     Allergies  Review of patient's allergies indicates no known allergies.  Home Medications   Prior to Admission medications   Medication Sig Start Date End Date Taking? Authorizing Provider  Multiple Vitamins-Minerals (MULTIVITAMIN WITH MINERALS) tablet Take 1 tablet by mouth daily.   Yes Historical Provider, MD  norethindrone-ethinyl estradiol (MICROGESTIN,JUNEL,LOESTRIN) 1-20 MG-MCG tablet Take 1 tablet by mouth daily. 07/05/14  Yes Anastasio Auerbach, MD   BP 138/90  Pulse 90  Temp(Src) 98.2 F (36.8 C) (Oral)  Resp 18  Ht 5\' 5"  (1.651 m)  Wt 185 lb (83.915 kg)  BMI 30.79 kg/m2  SpO2 100%  LMP 06/26/2014 Physical Exam  Nursing note and vitals reviewed. Constitutional: She is oriented to person, place, and time. She appears well-developed and well-nourished. No distress.  HENT:  Head: Normocephalic and atraumatic.  Mouth/Throat: Oropharynx is clear and moist.  Eyes: EOM are normal. Pupils are equal, round, and reactive to light.  Neck: Normal range of motion. Neck supple.  Cardiovascular: Normal rate and regular rhythm.   Pulmonary/Chest: Effort normal and breath sounds normal. No respiratory  distress. She has no wheezes. She has no rales.  Abdominal: Soft. Bowel sounds are normal. She exhibits no distension and no mass. There is no tenderness. There is no rebound and no guarding.  Musculoskeletal: Normal range of motion. She exhibits no edema and no tenderness.  Neurological: She is alert and oriented to person, place, and time.  Results urine is without deficit. Sensation is grossly intact.  Skin: Skin is warm  and dry. No rash noted. No erythema.  Psychiatric: She has a normal mood and affect. Her behavior is normal.    ED Course  Procedures (including critical care time) Labs Review Labs Reviewed  CBC WITH DIFFERENTIAL - Abnormal; Notable for the following:    Hemoglobin 11.9 (*)    HCT 35.4 (*)    All other components within normal limits  COMPREHENSIVE METABOLIC PANEL - Abnormal; Notable for the following:    Potassium 3.2 (*)    GFR calc non Af Amer 86 (*)    All other components within normal limits    Imaging Review No results found.   EKG Interpretation None      MDM   Final diagnoses:  None      Patient is feeling much better after IV fluids. Only one episode of loose stool in the emergency department. Abdomen remained soft and nontender. Return precautions given.  Julianne Rice, MD 07/08/14 [phone]

## 2014-07-08 NOTE — Discharge Instructions (Signed)

## 2014-10-01 ENCOUNTER — Other Ambulatory Visit: Payer: Self-pay

## 2014-10-18 ENCOUNTER — Encounter (HOSPITAL_COMMUNITY): Payer: Self-pay | Admitting: Emergency Medicine

## 2014-12-17 DIAGNOSIS — I10 Essential (primary) hypertension: Secondary | ICD-10-CM

## 2014-12-17 HISTORY — DX: Essential (primary) hypertension: I10

## 2015-07-08 ENCOUNTER — Other Ambulatory Visit: Payer: Self-pay | Admitting: Gynecology

## 2015-07-28 ENCOUNTER — Other Ambulatory Visit: Payer: Self-pay | Admitting: Gynecology

## 2015-10-12 ENCOUNTER — Encounter: Payer: Self-pay | Admitting: Gynecology

## 2015-12-16 ENCOUNTER — Encounter: Payer: Self-pay | Admitting: Gynecology

## 2015-12-16 ENCOUNTER — Ambulatory Visit (INDEPENDENT_AMBULATORY_CARE_PROVIDER_SITE_OTHER): Payer: 59 | Admitting: Gynecology

## 2015-12-16 VITALS — BP 124/82 | Ht 65.0 in | Wt 194.6 lb

## 2015-12-16 DIAGNOSIS — Z01419 Encounter for gynecological examination (general) (routine) without abnormal findings: Secondary | ICD-10-CM

## 2015-12-16 DIAGNOSIS — D251 Intramural leiomyoma of uterus: Secondary | ICD-10-CM | POA: Diagnosis not present

## 2015-12-16 NOTE — Patient Instructions (Signed)

## 2015-12-16 NOTE — Progress Notes (Signed)
Tammy Escobar May 15, 1976 [id number]        39 y.o.  [name]  for annual exam.  Several issues noted below.  Past medical history,surgical history, problem list, medications, allergies, family history and social history were all reviewed and documented as reviewed in the EPIC chart.  ROS:  Performed with pertinent positives and negatives included in the history, assessment and plan.   Additional significant findings :  none   Exam: Programmer, multimedia Vitals:   12/16/15 1459  BP: 124/82  Height: 5\' 5"  (1.651 m)  Weight: 194 lb 9.6 oz (88.27 kg)   General appearance:  Normal affect, orientation and appearance. Skin: Grossly normal HEENT: Without gross lesions.  No cervical or supraclavicular adenopathy. Thyroid normal.  Lungs:  Clear without wheezing, rales or rhonchi Cardiac: RR, without RMG Abdominal:  Soft, nontender, without masses, guarding, rebound, organomegaly or hernia Breasts:  Examined lying and sitting without masses, retractions, discharge or axillary adenopathy. Pelvic:  Ext/BUS/vagina normal  Cervix normal  Uterus anteverted, normal size, shape and contour, midline and mobile nontender   Adnexa  Without masses or tenderness    Anus and perineum  Normal   Rectovaginal  Normal sphincter tone without palpated masses or tenderness.    Assessment/Plan:  39 y.o. [name] female for annual exam with regular menses, no contraception.   1. No contraception. Patient was previously on low-dose oral contraceptives but stopped them several months ago in anticipation of pregnancy trial.  Has not started trying yet but thinks in another several months they will try. Currently on Lipitor and Lopressor but plans to wean off prior to pregnancy trial in conjunction with her primary physician. Currently is on a multivitamin with folic acid. 2. History of several small myomas on ultrasound. Exam shows uterus normal size. We'll follow with annual serial exams. 3. Pap smear/HPV negative  2013. No Pap smear done today. No history of significant abnormal Pap smears. Plan repeat in another year or 2 per current screening guidelines. 4. Mammography 2016. Patient had a mammogram/ultrasound earlier this year for palpated mass. Both were negative and she no longer is able to feel the area. Her exam today is normal. Will plan follow up mammogram at one-year interval from her last mammogram. SBE monthly reviewed. Report any palpable  Abnormalities. 5. Health maintenance. No routine lab work done as patient does this at her primary physician's office. Follow up 1 year, sooner as needed.   Anastasio Auerbach MD, 3:23 PM 12/16/2015

## 2016-02-15 ENCOUNTER — Other Ambulatory Visit: Payer: Self-pay | Admitting: Family Medicine

## 2016-02-15 ENCOUNTER — Ambulatory Visit
Admission: RE | Admit: 2016-02-15 | Discharge: 2016-02-15 | Disposition: A | Discharge status: Home / Self Care | Payer: Managed Care, Other (non HMO) | Source: Ambulatory Visit | Attending: Family Medicine | Admitting: Family Medicine

## 2016-02-15 DIAGNOSIS — R609 Edema, unspecified: Secondary | ICD-10-CM

## 2016-02-15 DIAGNOSIS — M25531 Pain in right wrist: Secondary | ICD-10-CM

## 2016-02-15 DIAGNOSIS — M79641 Pain in right hand: Secondary | ICD-10-CM

## 2016-05-02 ENCOUNTER — Other Ambulatory Visit: Payer: Self-pay

## 2016-05-02 DIAGNOSIS — Z1231 Encounter for screening mammogram for malignant neoplasm of breast: Secondary | ICD-10-CM

## 2016-05-15 ENCOUNTER — Encounter (HOSPITAL_COMMUNITY): Payer: Self-pay | Admitting: Emergency Medicine

## 2016-05-15 ENCOUNTER — Ambulatory Visit (INDEPENDENT_AMBULATORY_CARE_PROVIDER_SITE_OTHER): Payer: Managed Care, Other (non HMO)

## 2016-05-15 ENCOUNTER — Ambulatory Visit
Admission: RE | Admit: 2016-05-15 | Discharge: 2016-05-15 | Disposition: A | Discharge status: Home / Self Care | Payer: Managed Care, Other (non HMO) | Source: Ambulatory Visit

## 2016-05-15 ENCOUNTER — Ambulatory Visit (HOSPITAL_COMMUNITY)
Admission: EM | Admit: 2016-05-15 | Discharge: 2016-05-15 | Disposition: A | Discharge status: Home / Self Care | Payer: Managed Care, Other (non HMO) | Attending: Family Medicine | Admitting: Family Medicine

## 2016-05-15 DIAGNOSIS — J069 Acute upper respiratory infection, unspecified: Secondary | ICD-10-CM

## 2016-05-15 DIAGNOSIS — Z1231 Encounter for screening mammogram for malignant neoplasm of breast: Secondary | ICD-10-CM

## 2016-05-15 MED ORDER — HYDROCOD POLST-CPM POLST ER 10-8 MG/5ML PO SUER: 5.0000 mL | Freq: Two times a day (BID) | ORAL | Status: DC | PRN

## 2016-05-15 MED ORDER — IPRATROPIUM BROMIDE 0.06 % NA SOLN: 2.0000 | Freq: Four times a day (QID) | NASAL | Status: DC

## 2016-05-15 NOTE — ED Provider Notes (Signed)
CSN: [id number]     Arrival date & time 05/15/16  1417 History   First MD Initiated Contact with Patient 05/15/16 1516     Chief Complaint  Patient presents with  . Cough   (Consider location/radiation/quality/duration/timing/severity/associated sxs/prior Treatment) Patient is a 40 y.o. female presenting with cough. The history is provided by the patient.  Cough Cough characteristics:  Non-productive Severity:  Moderate Onset quality:  Gradual Duration:  6 days Progression:  Worsening Chronicity:  New Smoker: no   Context: weather changes   Context: not upper respiratory infection   Relieved by:  None tried Worsened by:  Nothing tried Ineffective treatments:  None tried Associated symptoms: rhinorrhea   Associated symptoms: no fever, no shortness of breath, no sore throat and no wheezing     Past Medical History  Diagnosis Date  . Leiomyoma   . High cholesterol   . Hypertension    Past Surgical History  Procedure Laterality Date  . Wisdom tooth extraction    . Vaginal delivery  2011   Family History  Problem Relation Age of Onset  . Heart disease Father   . Hypertension Father   . Cancer Father 4    prostate  . Breast cancer Maternal Grandmother   . Cancer Maternal Grandmother     BLADDER  . Breast cancer Paternal Grandmother   . Hypertension Mother   . Diabetes Maternal Aunt   . Hypertension Maternal Aunt   . Diabetes Maternal Uncle   . Hypertension Maternal Uncle   . Diabetes Paternal Aunt   . Hypertension Paternal Aunt   . Diabetes Paternal Uncle   . Hypertension Paternal Uncle    Social History  Substance Use Topics  . Smoking status: Never Smoker   . Smokeless tobacco: Never Used  . Alcohol Use: 0.0 oz/week    0 Standard drinks or equivalent per week     Comment: social   OB History    Gravida Para Term Preterm AB TAB SAB Ectopic Multiple Living   1 1        1      Review of Systems  Constitutional: Negative.  Negative for fever.  HENT:  Positive for congestion, postnasal drip and rhinorrhea. Negative for sore throat.   Respiratory: Positive for cough. Negative for shortness of breath and wheezing.   Cardiovascular: Negative.   All other systems reviewed and are negative.   Allergies  Review of patient's allergies indicates no known allergies.  Home Medications   Prior to Admission medications   Medication Sig Start Date End Date Taking? Authorizing Provider  Multiple Vitamins-Minerals (MULTIVITAMIN WITH MINERALS) tablet Take 1 tablet by mouth daily.   Yes Historical Provider, MD  atorvastatin (LIPITOR) 20 MG tablet Take 20 mg by mouth daily.    Historical Provider, MD  chlorpheniramine-HYDROcodone (TUSSIONEX PENNKINETIC ER) 10-8 MG/5ML SUER Take 5 mLs by mouth every 12 (twelve) hours as needed for cough. 05/15/16   Billy Fischer, MD  ipratropium (ATROVENT) 0.06 % nasal spray Place 2 sprays into both nostrils 4 (four) times daily. 05/15/16   Billy Fischer, MD  JUNEL 1/20 1-20 MG-MCG tablet TAKE 1 TABLET BY MOUTH DAILY. Patient not taking: Reported on 12/16/2015 07/28/15   Anastasio Auerbach, MD  metoprolol (LOPRESSOR) 50 MG tablet Take 50 mg by mouth 2 (two) times daily.    Historical Provider, MD  ondansetron (ZOFRAN ODT) 4 MG disintegrating tablet 4mg  ODT q4 hours prn nausea/vomit Patient not taking: Reported on 12/16/2015 07/08/14  Julianne Rice, MD   Meds Ordered and Administered this Visit  Medications - No data to display  BP 135/65 mmHg  Pulse 97  Temp(Src) 98.8 F (37.1 C) (Oral)  Resp 16  SpO2 100%  LMP 04/19/2016 (Exact Date) No data found.   Physical Exam  Constitutional: She is oriented to person, place, and time. She appears well-developed and well-nourished.  HENT:  Right Ear: External ear normal.  Left Ear: External ear normal.  Nose: Mucosal edema and rhinorrhea present.  Mouth/Throat: Oropharynx is clear and moist.  Eyes: Pupils are equal, round, and reactive to light.  Neck: Normal range  of motion. Neck supple.  Cardiovascular: Normal rate.   Pulmonary/Chest: Effort normal and breath sounds normal.  Lymphadenopathy:    She has no cervical adenopathy.  Neurological: She is alert and oriented to person, place, and time.  Skin: Skin is warm and dry.  Nursing note and vitals reviewed.   ED Course  Procedures (including critical care time)  Labs Review Labs Reviewed - No data to display  Imaging Review Dg Chest 2 View  05/15/2016  CLINICAL DATA:  Five day history of severe cough leading to chest pain. EXAM: CHEST  2 VIEW COMPARISON:  None. FINDINGS: Cardiomediastinal silhouette unremarkable. Lungs clear. Bronchovascular markings normal. Pulmonary vascularity normal. No visible pleural effusions. No pneumothorax. Possible calcified loose body in the right shoulder joint. Visualized bony thorax otherwise intact. IMPRESSION: No acute cardiopulmonary disease. Electronically Signed   By: Evangeline Dakin M.D.   On: 05/15/2016 15:56   X-rays reviewed and report per radiologist.   Visual Acuity Review  Right Eye Distance:   Left Eye Distance:   Bilateral Distance:    Right Eye Near:   Left Eye Near:    Bilateral Near:         MDM   1. URI (upper respiratory infection)        Billy Fischer, MD 05/15/16 [phone]

## 2016-05-15 NOTE — ED Notes (Signed)
Pt has been suffering from a cough since last Thursday.  She states it is getting progressively worse.  She denies any fever, nasal congestion, or any other concerning symptoms.

## 2016-09-03 ENCOUNTER — Ambulatory Visit (HOSPITAL_COMMUNITY)
Admission: EM | Admit: 2016-09-03 | Discharge: 2016-09-03 | Disposition: A | Discharge status: Home / Self Care | Payer: Managed Care, Other (non HMO) | Attending: Emergency Medicine | Admitting: Emergency Medicine

## 2016-09-03 ENCOUNTER — Encounter (HOSPITAL_COMMUNITY): Payer: Self-pay | Admitting: *Deleted

## 2016-09-03 DIAGNOSIS — W57XXXA Bitten or stung by nonvenomous insect and other nonvenomous arthropods, initial encounter: Secondary | ICD-10-CM | POA: Diagnosis not present

## 2016-09-03 DIAGNOSIS — T148 Other injury of unspecified body region: Secondary | ICD-10-CM

## 2016-09-03 MED ORDER — TRIAMCINOLONE ACETONIDE 0.1 % EX CREA: 1.0000 "application " | TOPICAL_CREAM | Freq: Two times a day (BID) | CUTANEOUS | 0 refills | Status: DC

## 2016-09-03 NOTE — ED Triage Notes (Signed)
Patient reports after being at fair yesterday she noticed insect bite to right foot, swelling, itching burning, and discomfort noted.

## 2016-09-03 NOTE — Discharge Instructions (Signed)
Apply triamcinolone cream twice a day as needed. May also apply Benadryl cream or gel every 4 hours as needed. For any stinging, swelling, burning or pain apply ice packs.

## 2016-09-03 NOTE — ED Provider Notes (Signed)
CSN: [id number]     Arrival date & time 09/03/16  1002 History   First MD Initiated Contact with Patient 09/03/16 1033     Chief Complaint  Patient presents with  . Insect Bite   (Consider location/radiation/quality/duration/timing/severity/associated sxs/prior Treatment) 40 year old female states that she was bitten by an insect to the top of her right foot yesterday. There was initial swelling and itching and discomfort. She states for few hours she was unable to dorsiflex or plantarflex her foot. Today there remains a small 1-2 mm slightly raised flesh-colored papule. No overlying swelling. No discoloration no lymphangitis or other signs of infection. No bleeding or bruising. Patient denies systemic symptoms.      Past Medical History:  Diagnosis Date  . High cholesterol   . Hypertension   . Leiomyoma    Past Surgical History:  Procedure Laterality Date  . VAGINAL DELIVERY  2011  . WISDOM TOOTH EXTRACTION     Family History  Problem Relation Age of Onset  . Heart disease Father   . Hypertension Father   . Cancer Father 34    prostate  . Hypertension Mother   . Breast cancer Maternal Grandmother   . Cancer Maternal Grandmother     BLADDER  . Breast cancer Paternal Grandmother   . Diabetes Maternal Aunt   . Hypertension Maternal Aunt   . Diabetes Maternal Uncle   . Hypertension Maternal Uncle   . Diabetes Paternal Aunt   . Hypertension Paternal Aunt   . Diabetes Paternal Uncle   . Hypertension Paternal Uncle    Social History  Substance Use Topics  . Smoking status: Never Smoker  . Smokeless tobacco: Never Used  . Alcohol use 0.0 oz/week     Comment: social   OB History    Gravida Para Term Preterm AB Living   1 1       1    SAB TAB Ectopic Multiple Live Births                 Review of Systems  Constitutional: Negative for activity change, fatigue and fever.  HENT: Negative.   Respiratory: Negative.   Gastrointestinal: Negative.   Musculoskeletal:  Negative.   Skin:       As per history of present illness  Neurological: Negative.   All other systems reviewed and are negative.   Allergies  Review of patient's allergies indicates no known allergies.  Home Medications   Prior to Admission medications   Medication Sig Start Date End Date Taking? Authorizing Provider  atorvastatin (LIPITOR) 20 MG tablet Take 20 mg by mouth daily.    Historical Provider, MD  chlorpheniramine-HYDROcodone (TUSSIONEX PENNKINETIC ER) 10-8 MG/5ML SUER Take 5 mLs by mouth every 12 (twelve) hours as needed for cough. 05/15/16   Billy Fischer, MD  ipratropium (ATROVENT) 0.06 % nasal spray Place 2 sprays into both nostrils 4 (four) times daily. 05/15/16   Billy Fischer, MD  JUNEL 1/20 1-20 MG-MCG tablet TAKE 1 TABLET BY MOUTH DAILY. Patient not taking: Reported on 12/16/2015 07/28/15   Anastasio Auerbach, MD  metoprolol (LOPRESSOR) 50 MG tablet Take 50 mg by mouth 2 (two) times daily.    Historical Provider, MD  Multiple Vitamins-Minerals (MULTIVITAMIN WITH MINERALS) tablet Take 1 tablet by mouth daily.    Historical Provider, MD  ondansetron (ZOFRAN ODT) 4 MG disintegrating tablet 4mg  ODT q4 hours prn nausea/vomit Patient not taking: Reported on 12/16/2015 07/08/14   Julianne Rice, MD  triamcinolone cream (KENALOG)  0.1 % Apply 1 application topically 2 (two) times daily. 09/03/16   Janne Napoleon, NP   Meds Ordered and Administered this Visit  Medications - No data to display  BP 123/74 (BP Location: Left Arm)   Pulse 85   Temp 98.6 F (37 C) (Oral)   Resp 14   LMP 08/17/2016   SpO2 100%  No data found.   Physical Exam  Constitutional: She is oriented to person, place, and time. She appears well-developed and well-nourished. No distress.  No evidence of systemic symptoms or signs.  HENT:  Head: Normocephalic and atraumatic.  Mouth/Throat: Oropharynx is clear and moist.  Eyes: EOM are normal.  Neck: Neck supple.  Cardiovascular: Normal rate.    Pulmonary/Chest: Effort normal. No respiratory distress. She has no wheezes.  Musculoskeletal: Normal range of motion. She exhibits no edema, tenderness or deformity.  Neurological: She is alert and oriented to person, place, and time.  Skin: Skin is warm and dry. She is not diaphoretic.  There is a 1-2 mm slightly raised papule to the dorsum of the right foot. No surrounding swelling, erythema, lymphangitis, discoloration or other abnormalities. No bleeding, no drainage.  Psychiatric: She has a normal mood and affect.  Nursing note and vitals reviewed.   Urgent Care Course   Clinical Course    Procedures (including critical care time)  Labs Review Labs Reviewed - No data to display  Imaging Review No results found.   Visual Acuity Review  Right Eye Distance:   Left Eye Distance:   Bilateral Distance:    Right Eye Near:   Left Eye Near:    Bilateral Near:         MDM   1. Insect bite    Apply triamcinolone cream twice a day as needed. May also apply Benadryl cream or gel every 4 hours as needed. For any stinging, swelling, burning or pain apply ice packs.     Janne Napoleon, NP 09/03/16 1051

## 2016-12-18 ENCOUNTER — Encounter: Payer: 59 | Admitting: Gynecology

## 2017-01-22 ENCOUNTER — Encounter: Payer: 59 | Admitting: Gynecology

## 2017-01-25 ENCOUNTER — Encounter: Payer: Self-pay | Admitting: Gynecology

## 2017-01-25 ENCOUNTER — Ambulatory Visit (INDEPENDENT_AMBULATORY_CARE_PROVIDER_SITE_OTHER): Payer: Managed Care, Other (non HMO) | Admitting: Gynecology

## 2017-01-25 VITALS — BP 120/74 | Ht 66.0 in | Wt 199.0 lb

## 2017-01-25 DIAGNOSIS — Z1151 Encounter for screening for human papillomavirus (HPV): Secondary | ICD-10-CM | POA: Diagnosis not present

## 2017-01-25 DIAGNOSIS — Z01419 Encounter for gynecological examination (general) (routine) without abnormal findings: Secondary | ICD-10-CM | POA: Diagnosis not present

## 2017-01-25 NOTE — Addendum Note (Signed)
Addended by: Nelva Nay on: 01/25/2017 04:07 PM   Modules accepted: Orders

## 2017-01-25 NOTE — Progress Notes (Signed)
    MAKYLIA RADOSEVICH 10-Aug-1976 [id number]        41 y.o.  [name] for annual exam.    Past medical history,surgical history, problem list, medications, allergies, family history and social history were all reviewed and documented as reviewed in the EPIC chart.  ROS:  Performed with pertinent positives and negatives included in the history, assessment and plan.   Additional significant findings :  None   Exam: [name] assistant Vitals:   01/25/17 1537  BP: 120/74  Weight: 199 lb (90.3 kg)  Height: 5\' 6"  (1.676 m)   Body mass index is 32.12 kg/m.  General appearance:  Normal affect, orientation and appearance. Skin: Grossly normal HEENT: Without gross lesions.  No cervical or supraclavicular adenopathy. Thyroid normal.  Lungs:  Clear without wheezing, rales or rhonchi Cardiac: RR, without RMG Abdominal:  Soft, nontender, without masses, guarding, rebound, organomegaly or hernia Breasts:  Examined lying and sitting without masses, retractions, discharge or axillary adenopathy. Pelvic:  Ext, BUS, Vagina normal  Cervix normal Pap smear/HPV  Uterus anteverted, normal size, shape and contour, midline and mobile nontender   Adnexa without masses or tenderness    Anus and perineum normal   Rectovaginal normal sphincter tone without palpated masses or tenderness.    Assessment/Plan:  41 y.o. [name] female for annual exam with regular menses, no contraception.   1. No contraception. Patient stopped her birth control pills previously. Would except pregnancy of occur but does not want to actively pursue pregnancy. I discussed decreased fecundity with advancing age that if she decides and future she wants to be more aggressive it may be more difficult. She understands this and said if it happens it does if not that's okay. Is on a multivitamin with folic acid. 2. Pap smear/HPV 2013. Pap smear/HPV today. No history of abnormal Pap smears previously. 3. Mammography 04/2016. Continue with  annual mammography this coming May. SBE monthly reviewed. 4. History of several small myomas on ultrasound. Bimanual shows uterus to be normal size. We'll continue with annual physical exam surveillance. 5. Health maintenance. No routine lab work done as she reports this done elsewhere. Follow up 1 year, sooner as needed.   Anastasio Auerbach MD, 3:59 PM 01/25/2017

## 2017-01-25 NOTE — Patient Instructions (Signed)

## 2017-01-30 LAB — PAP IG AND HPV HIGH-RISK: HPV DNA HIGH RISK: NOT DETECTED

## 2017-04-19 ENCOUNTER — Other Ambulatory Visit: Payer: Self-pay | Admitting: Gynecology

## 2017-04-19 DIAGNOSIS — Z1231 Encounter for screening mammogram for malignant neoplasm of breast: Secondary | ICD-10-CM

## 2017-05-16 ENCOUNTER — Ambulatory Visit
Admission: RE | Admit: 2017-05-16 | Discharge: 2017-05-16 | Disposition: A | Discharge status: Home / Self Care | Payer: Managed Care, Other (non HMO) | Source: Ambulatory Visit | Attending: Gynecology | Admitting: Gynecology

## 2017-05-16 DIAGNOSIS — Z1231 Encounter for screening mammogram for malignant neoplasm of breast: Secondary | ICD-10-CM

## 2017-06-20 ENCOUNTER — Other Ambulatory Visit: Payer: Self-pay | Admitting: Family Medicine

## 2017-06-20 ENCOUNTER — Ambulatory Visit
Admission: RE | Admit: 2017-06-20 | Discharge: 2017-06-20 | Disposition: A | Discharge status: Home / Self Care | Payer: Managed Care, Other (non HMO) | Source: Ambulatory Visit | Attending: Family Medicine | Admitting: Family Medicine

## 2017-06-20 DIAGNOSIS — M542 Cervicalgia: Secondary | ICD-10-CM

## 2017-07-03 IMAGING — DX DG CHEST 2V
2 series · 2 of 2 positions shown · non-contrast
Comparison: None.

CLINICAL DATA: Five day history of severe cough leading to chest
pain.

EXAM:
CHEST  2 VIEW

[chest pa]
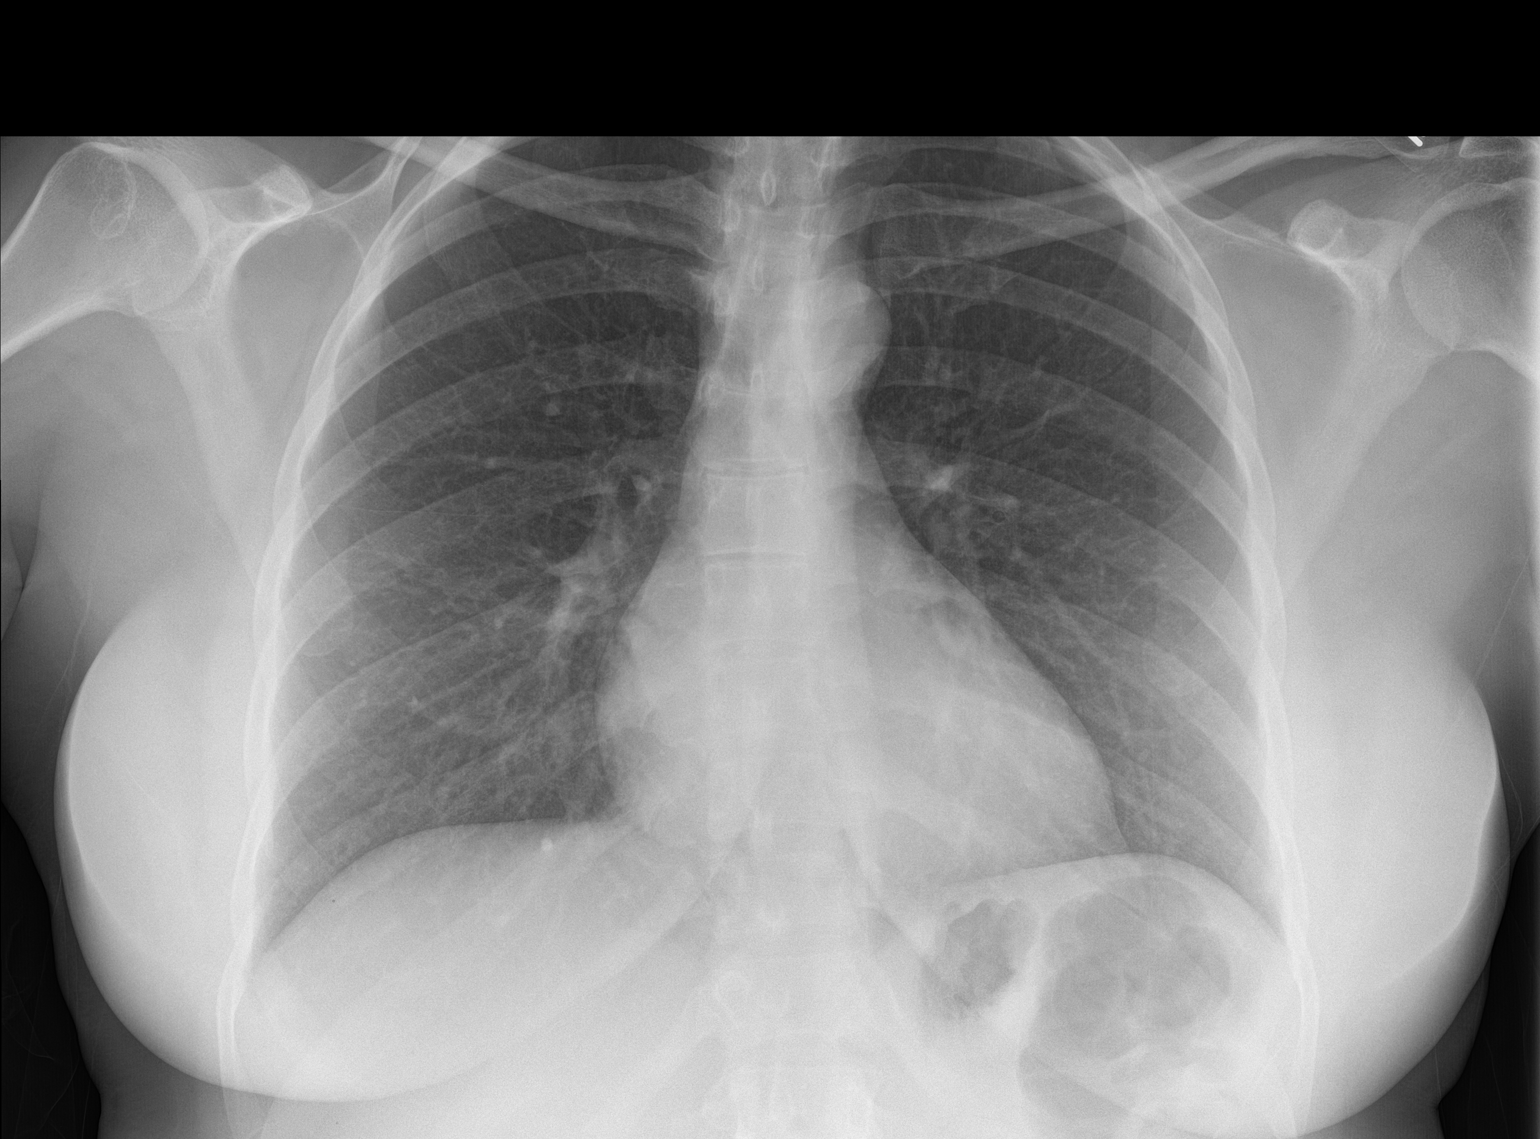

[chest lat]
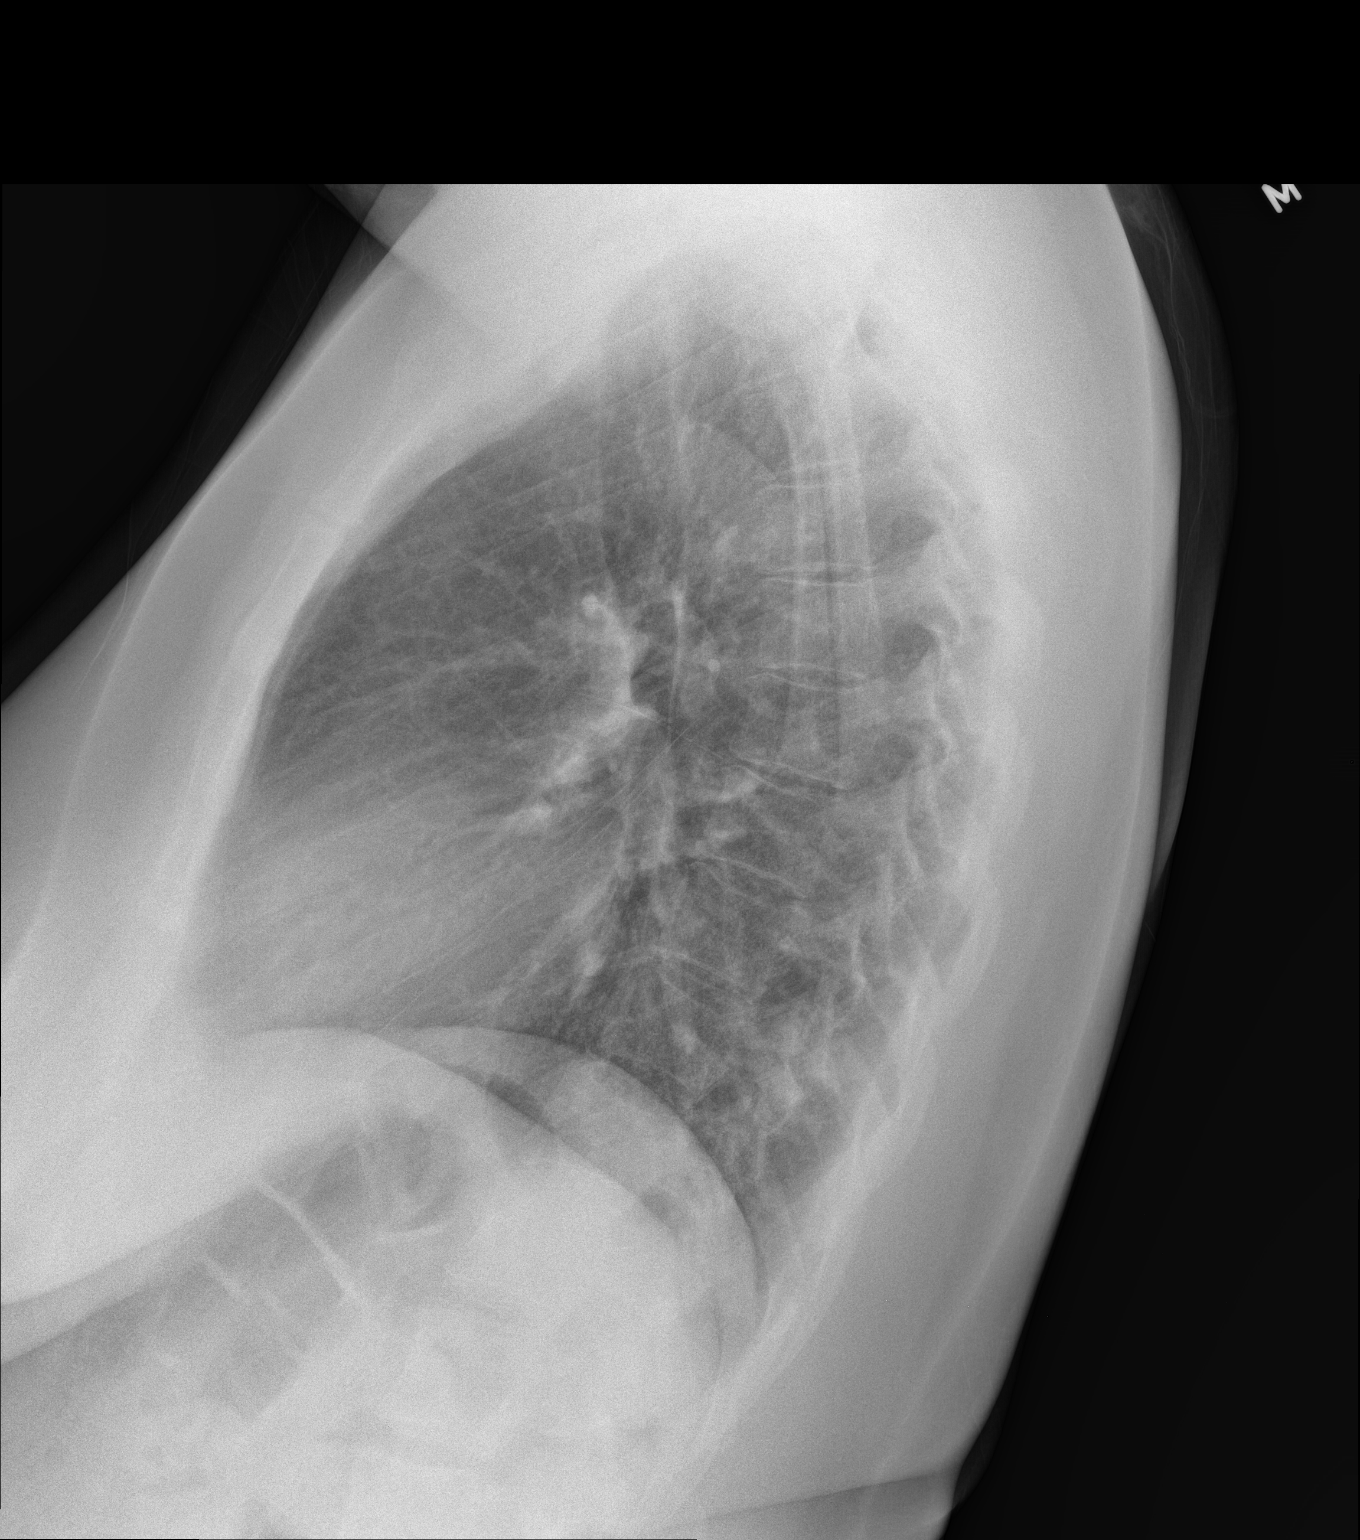

[2 of 2 positions shown; findings below may reference images not displayed]

FINDINGS: Cardiomediastinal silhouette unremarkable. Lungs clear.
Bronchovascular markings normal. Pulmonary vascularity normal. No
visible pleural effusions. No pneumothorax. Possible calcified loose
body in the right shoulder joint. Visualized bony thorax otherwise
intact.
IMPRESSION: No acute cardiopulmonary disease.

## 2018-01-27 ENCOUNTER — Encounter: Payer: Managed Care, Other (non HMO) | Admitting: Gynecology

## 2018-03-21 ENCOUNTER — Ambulatory Visit (INDEPENDENT_AMBULATORY_CARE_PROVIDER_SITE_OTHER): Payer: Managed Care, Other (non HMO) | Admitting: Gynecology

## 2018-03-21 ENCOUNTER — Encounter: Payer: Self-pay | Admitting: Gynecology

## 2018-03-21 VITALS — BP 116/76 | Ht 65.0 in | Wt 196.0 lb

## 2018-03-21 DIAGNOSIS — Z01419 Encounter for gynecological examination (general) (routine) without abnormal findings: Secondary | ICD-10-CM

## 2018-03-21 NOTE — Patient Instructions (Signed)
Follow-up in 1 year for annual exam, sooner if any issues. 

## 2018-03-21 NOTE — Progress Notes (Signed)
    Tammy Escobar [birth year] [id number]        42 y.o.  [name] for annual gynecologic exam.  Doing well without complaints.  Past medical history,surgical history, problem list, medications, allergies, family history and social history were all reviewed and documented as reviewed in the EPIC chart.  ROS:  Performed with pertinent positives and negatives included in the history, assessment and plan.   Additional significant findings : None   Exam: [name] assistant Vitals:   03/21/18 1103  BP: 116/76  Weight: 196 lb (88.9 kg)  Height: 5\' 5"  (1.651 m)   Body mass index is 32.62 kg/m.  General appearance:  Normal affect, orientation and appearance. Skin: Grossly normal HEENT: Without gross lesions.  No cervical or supraclavicular adenopathy. Thyroid normal.  Lungs:  Clear without wheezing, rales or rhonchi Cardiac: RR, without RMG Abdominal:  Soft, nontender, without masses, guarding, rebound, organomegaly or hernia Breasts:  Examined lying and sitting without masses, retractions, discharge or axillary adenopathy. Pelvic:  Ext, BUS, Vagina: Normal  Cervix: Normal  Uterus: Anteverted, normal size, shape and contour, midline and mobile nontender   Adnexa: Without masses or tenderness    Anus and perineum: Normal   Rectovaginal: Normal sphincter tone without palpated masses or tenderness.    Assessment/Plan:  42 y.o. [name] female for annual gynecologic exam with regular menses, no contraception.   1. No contraception.  Patient not protecting against pregnancy but not actively pursuing.  Would accept if it occurs.  Is on a multivitamin.  I asked her to make sure that it does have folic acid in it and she is going to check. 2. History of small myomas on ultrasound previously.  Exam shows uterus to palpate normal size.  We will continue with annual physical exam surveillance. 3. Mammography coming due end of May/June.  Reminded the patient to schedule.  Breast exam normal  today. 4. Pap smear/HPV 2018.  No Pap smear done today.  No history of significant abnormal Pap smears.  Plan repeat Pap smear at 5-year interval per current screening guidelines. 5. Health maintenance.  No routine lab work done as patient does this elsewhere.  Follow-up in 1 year, sooner as needed.   Anastasio Auerbach MD, 11:26 AM 03/21/2018

## 2018-04-23 ENCOUNTER — Other Ambulatory Visit: Payer: Self-pay | Admitting: Gynecology

## 2018-04-23 DIAGNOSIS — Z1231 Encounter for screening mammogram for malignant neoplasm of breast: Secondary | ICD-10-CM

## 2018-05-22 ENCOUNTER — Ambulatory Visit
Admission: RE | Admit: 2018-05-22 | Discharge: 2018-05-22 | Disposition: A | Discharge status: Home / Self Care | Payer: Managed Care, Other (non HMO) | Source: Ambulatory Visit | Attending: Gynecology | Admitting: Gynecology

## 2018-05-22 DIAGNOSIS — Z1231 Encounter for screening mammogram for malignant neoplasm of breast: Secondary | ICD-10-CM

## 2018-11-12 ENCOUNTER — Encounter (HOSPITAL_COMMUNITY): Payer: Self-pay

## 2018-11-12 ENCOUNTER — Other Ambulatory Visit: Payer: Self-pay

## 2018-11-12 ENCOUNTER — Ambulatory Visit (HOSPITAL_COMMUNITY)
Admission: EM | Admit: 2018-11-12 | Discharge: 2018-11-12 | Disposition: A | Discharge status: Home / Self Care | Payer: 59 | Attending: Family Medicine | Admitting: Family Medicine

## 2018-11-12 DIAGNOSIS — R2242 Localized swelling, mass and lump, left lower limb: Secondary | ICD-10-CM | POA: Diagnosis not present

## 2018-11-12 MED ORDER — HYDROCODONE-ACETAMINOPHEN 5-325 MG PO TABS: 1.0000 | ORAL_TABLET | Freq: Four times a day (QID) | ORAL | 0 refills | Status: DC | PRN

## 2018-11-12 MED ORDER — FUROSEMIDE 20 MG PO TABS: 20.0000 mg | ORAL_TABLET | Freq: Every day | ORAL | 0 refills | Status: DC

## 2018-11-12 NOTE — Discharge Instructions (Addendum)
Please take Lasix daily for the next 4 to 5 days Finish course of Keflex Keep foot elevated Monitor for improvement of swelling and redness If symptoms persisting, worsening, developing calf pain and swelling, chest pain or shortness of breath please follow-up in the emergency room

## 2018-11-12 NOTE — ED Triage Notes (Signed)
Pt cc left foot swelling pt was told she has cellulitis .  Pt states she's not getting well.

## 2018-11-13 NOTE — ED Provider Notes (Signed)
[place]    CSN: [id number] Arrival date & time: 11/12/18  1910     History   Chief Complaint Chief Complaint  Patient presents with  . Recurrent Skin Infections    HPI Tammy Escobar is a 42 y.o. female history of hypertension, hyperlipidemia presenting today for evaluation of left foot swelling, redness and pain.  Patient states that her symptoms began Saturday evening/early Sunday.  Since she has developed worsening redness, swelling and pain in her foot.  She feels the swelling has extended out from where it initially started.  She flew to [place] on Thursday and flew back on Sunday.  On Sunday she went to another urgent care and was treated for cellulitis with Keflex.  She has been on this for 3 to 4 days and has not seen any improvement, has had worsening symptoms despite taking the Keflex.  Is largely had to keep her legs elevated due to pain.  Denies calf pain or tenderness.  Denies previous DVT/PE.  Denies smoking history.  Denies OCP use.  Denies chest pain or shortness of breath.  Is unsure if swelling worsens with standing/dependency.  HPI  Past Medical History:  Diagnosis Date  . High cholesterol   . Hypertension   . Leiomyoma     There are no active problems to display for this patient.   Past Surgical History:  Procedure Laterality Date  . VAGINAL DELIVERY  2011  . WISDOM TOOTH EXTRACTION      OB History    Gravida  1   Para  1   Term      Preterm      AB      Living  1     SAB      TAB      Ectopic      Multiple      Live Births               Home Medications    Prior to Admission medications   Medication Sig Start Date End Date Taking? Authorizing Provider  furosemide (LASIX) 20 MG tablet Take 1 tablet (20 mg total) by mouth daily for 5 days. 11/12/18 11/17/18  Hadriel Northup C, PA-C  HYDROcodone-acetaminophen (NORCO/VICODIN) 5-325 MG tablet Take 1 tablet by mouth every 6 (six) hours as needed. 11/12/18    Janese Radabaugh C, PA-C  Multiple Vitamins-Minerals (MULTIVITAMIN WITH MINERALS) tablet Take 1 tablet by mouth daily.    [provider]    Family History Family History  Problem Relation Age of Onset  . Heart disease Father   . Hypertension Father   . Cancer Father 36       prostate,bladder  . Hypertension Mother   . Rheum arthritis Mother   . Thyroid disease Mother   . Breast cancer Maternal Grandmother 42  . Cancer Maternal Grandmother        BLADDER  . Breast cancer Paternal Grandmother        ? age  . Diabetes Maternal Aunt   . Hypertension Maternal Aunt   . Diabetes Maternal Uncle   . Hypertension Maternal Uncle   . Diabetes Paternal Aunt   . Hypertension Paternal Aunt   . Diabetes Paternal Uncle   . Hypertension Paternal Uncle     Social History Social History   Tobacco Use  . Smoking status: Never Smoker  . Smokeless tobacco: Never Used  Substance Use Topics  . Alcohol use: Yes    Alcohol/week: 0.0 standard  drinks    Comment: social  . Drug use: No     Allergies   Patient has no known allergies.   Review of Systems Review of Systems  Constitutional: Negative for fatigue and fever.  HENT: Negative for mouth sores.   Eyes: Negative for visual disturbance.  Respiratory: Negative for cough and shortness of breath.   Cardiovascular: Positive for leg swelling. Negative for chest pain.  Gastrointestinal: Negative for abdominal pain, nausea and vomiting.  Musculoskeletal: Positive for gait problem and myalgias. Negative for arthralgias and joint swelling.  Skin: Positive for color change. Negative for rash and wound.  Neurological: Negative for dizziness, weakness, light-headedness and headaches.     Physical Exam Triage Vital Signs ED Triage Vitals  Enc Vitals Group     BP 11/12/18 1934 130/85     Pulse Rate 11/12/18 1934 77     Resp 11/12/18 1934 18     Temp 11/12/18 1934 98.4 F (36.9 C)     Temp src --      SpO2 11/12/18 1934 100 %       Weight 11/12/18 1932 169 lb (76.7 kg)     Height --      Head Circumference --      Peak Flow --      Pain Score 11/12/18 1932 6     Pain Loc --      Pain Edu? --      Excl. in [place]? --    No data found.  Updated Vital Signs BP 130/85 (BP Location: Right Arm)   Pulse 77   Temp 98.4 F (36.9 C)   Resp 18   Wt 169 lb (76.7 kg)   SpO2 100%   BMI 28.12 kg/m   Visual Acuity Right Eye Distance:   Left Eye Distance:   Bilateral Distance:    Right Eye Near:   Left Eye Near:    Bilateral Near:     Physical Exam  Constitutional: She appears well-developed and well-nourished. No distress.  HENT:  Head: Normocephalic and atraumatic.  Eyes: Conjunctivae are normal.  Neck: Neck supple.  Cardiovascular: Normal rate and regular rhythm.  No murmur heard. Pulmonary/Chest: Effort normal and breath sounds normal. No respiratory distress.  Breathing comfortably at rest, CTABL, no wheezing, rales or other adventitious sounds auscultated  Abdominal: Soft. There is no tenderness.  Musculoskeletal: She exhibits no edema.  Left foot with swelling and erythema extending to lateral malleolus, does not extend into the calf, no calf tenderness or erythema/swelling, negative Homans  Foot does not feel significantly warmer compared to right dorsalis pedis and posterior tibialis pulse palpated, but weaker due to swelling  1+ pitting edema at dorsum of foot and near lateral malleolus  Neurological: She is alert.  Skin: Skin is warm and dry.  Psychiatric: She has a normal mood and affect.  Nursing note and vitals reviewed.    UC Treatments / Results  Labs (all labs ordered are listed, but only abnormal results are displayed) Labs Reviewed - No data to display  EKG None  Radiology No results found.  Procedures Procedures (including critical care time)  Medications Ordered in UC Medications - No data to display  Initial Impression / Assessment and Plan / UC Course  I have  reviewed the triage vital signs and the nursing notes.  Pertinent labs & imaging results that were available during my care of the patient were reviewed by me and considered in my medical decision making (see chart for  details).    Case discussed with Dr. Mannie Stabile who also evaluated patient and is agreeable with plan. Patient with foot swelling and erythema, not improving on antibiotics for cellulitis, foot is not significantly warm, making cellulitis less likely.  Patient did have recent travel on airplanes, but no other risk factors for DVT, atypical presentation as not involving the calf.  Patient has pitting edema, will treat with Lasix and have close monitoring and follow-up.  Provided few tablets of hydrocodone to help with pain while sleeping.  Patient is to follow-up in emergency room if having worsening swelling, pain or symptoms extending further up the leg to definitively rule out DVT.  Patient also to follow-up if developing chest pain or shortness of breath. Discussed strict return precautions. Patient verbalized understanding and is agreeable with plan.  Final Clinical Impressions(s) / UC Diagnoses   Final diagnoses:  Localized swelling of left foot     Discharge Instructions     Please take Lasix daily for the next 4 to 5 days Finish course of Keflex Keep foot elevated Monitor for improvement of swelling and redness If symptoms persisting, worsening, developing calf pain and swelling, chest pain or shortness of breath please follow-up in the emergency room   ED Prescriptions    Medication Sig Dispense Auth. Provider   furosemide (LASIX) 20 MG tablet Take 1 tablet (20 mg total) by mouth daily for 5 days. 10 tablet Heaton Sarin C, PA-C   HYDROcodone-acetaminophen (NORCO/VICODIN) 5-325 MG tablet Take 1 tablet by mouth every 6 (six) hours as needed. 4 tablet Shundra Wirsing C, PA-C     Controlled Substance Prescriptions [place] Controlled Substance Registry consulted? Not  Applicable   Janith Lima, [place] 11/13/18 1152

## 2019-03-23 ENCOUNTER — Encounter: Payer: Managed Care, Other (non HMO) | Admitting: Gynecology

## 2019-04-06 ENCOUNTER — Other Ambulatory Visit: Payer: Self-pay

## 2019-04-08 ENCOUNTER — Encounter: Payer: Self-pay | Admitting: Gynecology

## 2019-04-08 ENCOUNTER — Other Ambulatory Visit: Payer: Self-pay

## 2019-04-08 ENCOUNTER — Ambulatory Visit (INDEPENDENT_AMBULATORY_CARE_PROVIDER_SITE_OTHER): Payer: 59 | Admitting: Gynecology

## 2019-04-08 VITALS — BP 114/70 | Ht 65.0 in | Wt 173.0 lb

## 2019-04-08 DIAGNOSIS — Z01419 Encounter for gynecological examination (general) (routine) without abnormal findings: Secondary | ICD-10-CM

## 2019-04-08 NOTE — Patient Instructions (Signed)
Follow-up in 1 year for annual exam, sooner as needed. 

## 2019-04-08 NOTE — Progress Notes (Signed)
    Tammy Escobar 04/02/1976 [id number]        43 y.o.  [name] for annual gynecologic exam.  Without gynecologic complaints  Past medical history,surgical history, problem list, medications, allergies, family history and social history were all reviewed and documented as reviewed in the EPIC chart.  ROS:  Performed with pertinent positives and negatives included in the history, assessment and plan.   Additional significant findings : None   Exam: Tammy Escobar assistant Vitals:   04/08/19 0833  BP: 114/70  Weight: 173 lb (78.5 kg)  Height: 5\' 5"  (1.651 m)   Body mass index is 28.79 kg/m.  General appearance:  Normal affect, orientation and appearance. Skin: Grossly normal HEENT: Without gross lesions.  No cervical or supraclavicular adenopathy. Thyroid normal.  Lungs:  Clear without wheezing, rales or rhonchi Cardiac: RR, without RMG Abdominal:  Soft, nontender, without masses, guarding, rebound, organomegaly or hernia Breasts:  Examined lying and sitting without masses, retractions, discharge or axillary adenopathy. Pelvic:  Ext, BUS, Vagina: Normal  Cervix: Normal  Uterus: Anteverted, normal size, shape and contour, midline and mobile nontender   Adnexa: Without masses or tenderness    Anus and perineum: Normal   Rectovaginal: Normal sphincter tone without palpated masses or tenderness.    Assessment/Plan:  43 y.o. [name] female for annual gynecologic exam.  With regular menses, no contraception  1. Contraception.  Patient not using contraception.  Would accept pregnancy if it occurs but is not actively pursuing.  Is on a multivitamin daily. 2. Mammography coming due in June.  Breast exam normal today. 3. History of small myomas.  Exam shows uterus normal size.  Continue with annual exam surveillance. 4. Pap smear/HPV 2018.  No Pap smear done today.  No history of abnormal Pap smears.  Plan repeat Pap smear/HPV at 5-year interval per current screening guidelines. 5.  Health maintenance.  No routine lab work done as patient does this elsewhere.  Follow-up 1 year, sooner as needed.   Tammy Auerbach MD, 9:02 AM 04/08/2019

## 2019-07-15 ENCOUNTER — Other Ambulatory Visit: Payer: Self-pay | Admitting: Internal Medicine

## 2019-07-15 DIAGNOSIS — Z1231 Encounter for screening mammogram for malignant neoplasm of breast: Secondary | ICD-10-CM

## 2019-08-31 ENCOUNTER — Ambulatory Visit
Admission: RE | Admit: 2019-08-31 | Discharge: 2019-08-31 | Disposition: A | Discharge status: Home / Self Care | Payer: 59 | Source: Ambulatory Visit | Attending: Internal Medicine | Admitting: Internal Medicine

## 2019-08-31 ENCOUNTER — Other Ambulatory Visit: Payer: Self-pay

## 2019-08-31 DIAGNOSIS — Z1231 Encounter for screening mammogram for malignant neoplasm of breast: Secondary | ICD-10-CM

## 2019-09-01 ENCOUNTER — Other Ambulatory Visit: Payer: Self-pay | Admitting: Internal Medicine

## 2019-09-01 DIAGNOSIS — R928 Other abnormal and inconclusive findings on diagnostic imaging of breast: Secondary | ICD-10-CM

## 2019-09-03 ENCOUNTER — Other Ambulatory Visit: Payer: Self-pay

## 2019-09-03 ENCOUNTER — Ambulatory Visit
Admission: RE | Admit: 2019-09-03 | Discharge: 2019-09-03 | Disposition: A | Discharge status: Home / Self Care | Payer: 59 | Source: Ambulatory Visit | Attending: Internal Medicine | Admitting: Internal Medicine

## 2019-09-03 DIAGNOSIS — R928 Other abnormal and inconclusive findings on diagnostic imaging of breast: Secondary | ICD-10-CM

## 2019-09-08 ENCOUNTER — Encounter: Payer: Self-pay | Admitting: Gynecology

## 2020-04-08 ENCOUNTER — Encounter: Payer: 59 | Admitting: Obstetrics and Gynecology

## 2020-04-13 ENCOUNTER — Encounter: Payer: 59 | Admitting: Obstetrics and Gynecology

## 2020-04-21 ENCOUNTER — Other Ambulatory Visit: Payer: Self-pay

## 2020-04-22 ENCOUNTER — Encounter: Payer: Self-pay | Admitting: Obstetrics and Gynecology

## 2020-04-22 ENCOUNTER — Ambulatory Visit (INDEPENDENT_AMBULATORY_CARE_PROVIDER_SITE_OTHER): Payer: 59 | Admitting: Obstetrics and Gynecology

## 2020-04-22 VITALS — BP 118/74 | Ht 65.0 in | Wt 187.0 lb

## 2020-04-22 DIAGNOSIS — Z01419 Encounter for gynecological examination (general) (routine) without abnormal findings: Secondary | ICD-10-CM | POA: Diagnosis not present

## 2020-04-22 NOTE — Progress Notes (Signed)
   Tammy Escobar [birth year] [id number]  SUBJECTIVE:  44 y.o. [name] female for annual routine gynecologic exam. She has no gynecologic concerns.  Current Outpatient Medications  Medication Sig Dispense Refill  . Escitalopram Oxalate (LEXAPRO PO) Take by mouth.    . Multiple Vitamins-Minerals (MULTIVITAMIN WITH MINERALS) tablet Take 1 tablet by mouth daily.    . traZODone (DESYREL) 100 MG tablet Take 100 mg by mouth at bedtime.     No current facility-administered medications for this visit.   Allergies: Patient has no known allergies.  Patient's last menstrual period was 04/13/2020.  Past medical history,surgical history, problem list, medications, allergies, family history and social history were all reviewed and documented as reviewed in the EPIC chart.  ROS:  Feeling well. No dyspnea or chest pain on exertion.  No abdominal pain, change in bowel habits, black or bloody stools.  No urinary tract symptoms. GYN ROS: normal menses, no abnormal bleeding, pelvic pain or discharge, no breast pain or new or enlarging lumps on self exam. No neurological complaints.   OBJECTIVE:  Ht 5\' 5"  (1.651 m)   Wt 187 lb (84.8 kg)   LMP 04/13/2020   BMI 31.12 kg/m  The patient appears well, alert, oriented x 3, in no distress. ENT normal.  Neck supple. No cervical or supraclavicular adenopathy or thyromegaly.  Lungs are clear, good air entry, no wheezes, rhonchi or rales. S1 and S2 normal, no murmurs, regular rate and rhythm.  Abdomen soft without tenderness, guarding, mass or organomegaly.  Neurological is normal, no focal findings.  BREAST EXAM: breasts appear normal, no suspicious masses, no skin or nipple changes or axillary nodes  PELVIC EXAM: VULVA: normal appearing vulva with no masses, tenderness or lesions, VAGINA: normal appearing vagina with normal color and discharge, no lesions, CERVIX: normal appearing cervix without discharge or lesions, UTERUS: anteverted uterus is normal size,  shape, consistency and nontender, ADNEXA: normal adnexa in size, nontender and no masses   Chaperone: [name] present during the examination  ASSESSMENT:  44 y.o. [name] here for annual gynecologic exam  PLAN:   1. No hormonal or menstrual concerns.  2. Pap smear/HPV 2018.  No Pap smear today.  No significant history of abnormal Pap smears.  Next Pap smear due 2023 following the current guidelines recommending the 5 year cotesting interval. 3. Contraception.  She declines need for contraception right now.  She will accept a pregnancy if it occurs but also not actively pursuing it.  I encouraged her to make sure that she is on a vitamin with the recommended amount of folic acid in case inception would occur. 4. History of small fibroids.  Uterus palpates normal on exam today.  Continue annual exam surveillance. 5. Mammogram 08/2019.  Breast exam normal.  Continue annual mammograms 6. Health maintenance.  No labs today as she normally has these completed with her primary care provider.  Return annually or sooner, prn.  Joseph Pierini MD 04/22/20

## 2020-05-30 ENCOUNTER — Encounter (HOSPITAL_COMMUNITY): Payer: Self-pay

## 2020-05-30 ENCOUNTER — Ambulatory Visit (HOSPITAL_COMMUNITY)
Admission: EM | Admit: 2020-05-30 | Discharge: 2020-05-30 | Disposition: A | Discharge status: Home / Self Care | Payer: 59 | Attending: Family Medicine | Admitting: Family Medicine

## 2020-05-30 ENCOUNTER — Other Ambulatory Visit: Payer: Self-pay

## 2020-05-30 DIAGNOSIS — W57XXXA Bitten or stung by nonvenomous insect and other nonvenomous arthropods, initial encounter: Secondary | ICD-10-CM

## 2020-05-30 DIAGNOSIS — T7840XA Allergy, unspecified, initial encounter: Secondary | ICD-10-CM

## 2020-05-30 MED ORDER — PREDNISONE 20 MG PO TABS: 20.0000 mg | ORAL_TABLET | Freq: Two times a day (BID) | ORAL | 0 refills | Status: DC

## 2020-05-30 NOTE — ED Triage Notes (Signed)
Pt sates the insect bite started as a bunch of bumps on Friday. Then she put witch hazel and neosporin, then she noticed a white dot in the middle yesterday. Pt's states mid day today there were "welts going through the bump." Pt states the bite is throbbing and tingling and the erythematous area started getting bigger. Pt has a large erythematous area with a bump in the middle on the right anterior forearm.

## 2020-05-30 NOTE — ED Provider Notes (Signed)
[facility]    CSN: [id number] Arrival date & time: 05/30/20  1734      History   Chief Complaint Chief Complaint  Patient presents with  . Insect Bite    HPI Tammy Escobar is a 44 y.o. female.   HPI  Patient was sitting outside 4 days ago.  Received an insect bite to her right forearm.  Is gotten bigger over time.  It is now several inches across.  Itches mildly.  Stings mildly.  No pain.  No fever or chills.  She is never had an allergic reaction to an insect bite before.  She has been using a multitude of treatments including Benadryl, Tylenol, cortisone cream, tea tree oil, and Neosporin ointment.  None of these things have helped No mouth swelling, voice change, shortness of breath  Past Medical History:  Diagnosis Date  . Anxiety   . Depression   . High cholesterol   . Hypertension   . Leiomyoma     There are no problems to display for this patient.   Past Surgical History:  Procedure Laterality Date  . VAGINAL DELIVERY  2011  . WISDOM TOOTH EXTRACTION      OB History    Gravida  1   Para  1   Term      Preterm      AB      Living  1     SAB      TAB      Ectopic      Multiple      Live Births               Home Medications    Prior to Admission medications   Medication Sig Start Date End Date Taking? Authorizing Provider  Vitamin D, Ergocalciferol, (DRISDOL) 1.25 MG (50000 UNIT) CAPS capsule Take 50,000 Units by mouth every 7 (seven) days.   Yes [provider]  clonazePAM (KLONOPIN) 0.5 MG tablet Take 0.5 mg by mouth 2 (two) times daily.    [provider]  Multiple Vitamins-Minerals (MULTIVITAMIN WITH MINERALS) tablet Take 1 tablet by mouth daily.    [provider]  predniSONE (DELTASONE) 20 MG tablet Take 1 tablet (20 mg total) by mouth 2 (two) times daily with a meal. 05/30/20   Raylene Everts, MD  traZODone (DESYREL) 100 MG tablet Take 100 mg by mouth at bedtime.    [provider]  TRINTELLIX 10 MG TABS tablet Take 10 mg by mouth daily. 05/28/20   [provider]  Escitalopram Oxalate (LEXAPRO PO) Take by mouth.  05/30/20  [provider]    Family History Family History  Problem Relation Age of Onset  . Heart disease Father   . Hypertension Father   . Cancer Father 26       prostate,bladder  . Hypertension Mother   . Rheum arthritis Mother   . Thyroid disease Mother   . Breast cancer Maternal Grandmother 52  . Cancer Maternal Grandmother        BLADDER  . Breast cancer Paternal Grandmother        ? age  . Diabetes Maternal Aunt   . Hypertension Maternal Aunt   . Diabetes Maternal Uncle   . Hypertension Maternal Uncle   . Diabetes Paternal Aunt   . Hypertension Paternal Aunt   . Diabetes Paternal Uncle   . Hypertension Paternal Uncle     Social History Social History   Tobacco Use  .  Smoking status: Never Smoker  . Smokeless tobacco: Never Used  Vaping Use  . Vaping Use: Never used  Substance Use Topics  . Alcohol use: Yes    Alcohol/week: 0.0 standard drinks    Comment: social  . Drug use: No     Allergies   Patient has no known allergies.   Review of Systems Review of Systems  Skin: Positive for rash.    Physical Exam Triage Vital Signs ED Triage Vitals [05/30/20 1752]  Enc Vitals Group     BP 129/79     Pulse Rate 85     Resp 16     Temp 99.5 F (37.5 C)     Temp Source Oral     SpO2 98 %     Weight 171 lb (77.6 kg)     Height 5\' 6"  (1.676 m)     Head Circumference      Peak Flow      Pain Score 4     Pain Loc      Pain Edu?      Excl. in [place]?    No data found.  Updated Vital Signs BP 129/79   Pulse 85   Temp 99.5 F (37.5 C) (Oral)   Resp 16   Ht 5\' 6"  (1.676 m)   Wt 77.6 kg   SpO2 98%   BMI 27.60 kg/m      Physical Exam Constitutional:      General: She is not in acute distress.    Appearance: She is well-developed.  HENT:     Head: Normocephalic and atraumatic.    Eyes:     Conjunctiva/sclera: Conjunctivae normal.     Pupils: Pupils are equal, round, and reactive to light.  Cardiovascular:     Rate and Rhythm: Normal rate.  Pulmonary:     Effort: Pulmonary effort is normal. No respiratory distress.  Musculoskeletal:        General: Normal range of motion.     Cervical back: Normal range of motion.  Skin:    General: Skin is warm and dry.       Neurological:     Mental Status: She is alert.  Psychiatric:        Mood and Affect: Mood normal.        Behavior: Behavior normal.      UC Treatments / Results  Labs (all labs ordered are listed, but only abnormal results are displayed) Labs Reviewed - No data to display  EKG   Radiology No results found.  Procedures Procedures (including critical care time)  Medications Ordered in UC Medications - No data to display  Initial Impression / Assessment and Plan / UC Course  I have reviewed the triage vital signs and the nursing notes.  Pertinent labs & imaging results that were available during my care of the patient were reviewed by me and considered in my medical decision making (see chart for details).     Discussed allergic reaction Final Clinical Impressions(s) / UC Diagnoses   Final diagnoses:  Allergic reaction, initial encounter  Insect bite of right forearm, initial encounter     Discharge Instructions     May continue antihistamines like benadryl or zyrtec/claritin for the allergic reaction Take the prednisone 2 x a day Stop the neosporin Continue cortisone cream This should resolve in a few days   ED Prescriptions    Medication Sig Dispense Auth. Provider   predniSONE (DELTASONE) 20 MG tablet Take 1 tablet (20  mg total) by mouth 2 (two) times daily with a meal. 10 tablet Raylene Everts, MD     PDMP not reviewed this encounter.   Raylene Everts, MD 05/30/20 [phone]

## 2020-05-30 NOTE — Discharge Instructions (Addendum)
May continue antihistamines like benadryl or zyrtec/claritin for the allergic reaction Take the prednisone 2 x a day Stop the neosporin Continue cortisone cream This should resolve in a few days

## 2020-08-25 ENCOUNTER — Other Ambulatory Visit: Payer: Self-pay | Admitting: Internal Medicine

## 2020-08-25 ENCOUNTER — Encounter: Payer: Self-pay | Admitting: Obstetrics and Gynecology

## 2020-08-25 DIAGNOSIS — Z1231 Encounter for screening mammogram for malignant neoplasm of breast: Secondary | ICD-10-CM

## 2020-08-25 NOTE — Telephone Encounter (Signed)
Patient scheduled appointment for September 10 with Dr Delilah Shan.

## 2020-08-26 ENCOUNTER — Telehealth: Payer: Self-pay | Admitting: *Deleted

## 2020-08-26 ENCOUNTER — Ambulatory Visit (INDEPENDENT_AMBULATORY_CARE_PROVIDER_SITE_OTHER): Payer: 59 | Admitting: Obstetrics and Gynecology

## 2020-08-26 ENCOUNTER — Encounter: Payer: Self-pay | Admitting: Obstetrics and Gynecology

## 2020-08-26 ENCOUNTER — Other Ambulatory Visit: Payer: Self-pay

## 2020-08-26 VITALS — BP 124/78

## 2020-08-26 DIAGNOSIS — N644 Mastodynia: Secondary | ICD-10-CM | POA: Diagnosis not present

## 2020-08-26 NOTE — Telephone Encounter (Signed)
Patient scheduled at breast center on 09/08/20 @ 9:15am. My chart message sent.

## 2020-08-26 NOTE — Progress Notes (Signed)
   Tammy Escobar July 20, 1976 [id number]  SUBJECTIVE:  44 y.o. [name] female presents for evaluation of a right breast ache and soreness that has been present for the last week or two.  When she thinks about it she actually has noticed this sporadic and fleeting sensation for quite some time now.  This sensation lasts only for a second or two and it is random in onset.  No stabbing sensation, no radiation.  She does lay on that side more frequently.  She cannot recall anything exacerbating or alleviating factors.  No recent changes to bra or cup size.  No nipple discharge.  She does check her breasts regularly herself and has not noticed any lumps or changes.   Current Outpatient Medications  Medication Sig Dispense Refill  . clonazePAM (KLONOPIN) 0.5 MG tablet Take 0.5 mg by mouth 2 (two) times daily.    . Multiple Vitamins-Minerals (MULTIVITAMIN WITH MINERALS) tablet Take 1 tablet by mouth daily.    . predniSONE (DELTASONE) 20 MG tablet Take 1 tablet (20 mg total) by mouth 2 (two) times daily with a meal. 10 tablet 0  . traZODone (DESYREL) 100 MG tablet Take 100 mg by mouth at bedtime.    . TRINTELLIX 10 MG TABS tablet Take 10 mg by mouth daily.    . Vitamin D, Ergocalciferol, (DRISDOL) 1.25 MG (50000 UNIT) CAPS capsule Take 50,000 Units by mouth every 7 (seven) days.     No current facility-administered medications for this visit.   Allergies: Patient has no known allergies.  Patient's last menstrual period was 08/02/2020.  Past medical history,surgical history, problem list, medications, allergies, family history and social history were all reviewed and documented as reviewed in the EPIC chart.   OBJECTIVE:  BP 124/78 (BP Location: Right Arm, Patient Position: Sitting, Cuff Size: Normal)   LMP 08/02/2020  The patient appears well, alert, oriented x 3, in no distress.  BREAST EXAM: right breast normal without mass, skin or nipple changes or axillary nodes No pain on palpation in area  where she feels the sensation which is approximately the mid axillary line at the right lateral inferior portion aspect of the breast tissue tail of Spence  Chaperone: Wandra Scot Bonham present during the examination  ASSESSMENT:  44 y.o. [name] here for evaluation of sporadic mild right breast pain of uncertain etiology  PLAN:  I encouraged her to keep track of with a diary of relation of the timing of onset of the symptoms and her menstrual cycle. Breast exam is reassuring today.  I suspect that it is a musculoskeletal issue.  Since she did have some density identified on mammogram last year and required diagnostic scanning, I would like her to get another diagnostic mammogram/imaging of the right breast when she goes in for her screening mammogram here this year.  I wrote a note to staff to help assist her with this.  She will follow up with any ongoing concerns or worsening of symptoms.   Joseph Pierini MD 08/26/20

## 2020-08-26 NOTE — Telephone Encounter (Signed)
-----   Message from Joseph Pierini, MD sent at 08/26/2020 11:59 AM EDT ----- Regarding: diagnostic mammogram Please have patient do diagnostic mammogram of right lateral breast near the axillary line where she is having discomfort, no palpable masses but she also had a diagnostic of this side last year too and was found to have some dense tissue.

## 2020-08-31 NOTE — Telephone Encounter (Signed)
Patient informed with time and date.  

## 2020-09-08 ENCOUNTER — Other Ambulatory Visit: Payer: 59

## 2020-09-09 ENCOUNTER — Ambulatory Visit: Payer: 59

## 2020-09-09 ENCOUNTER — Other Ambulatory Visit: Payer: Self-pay

## 2020-09-09 ENCOUNTER — Ambulatory Visit
Admission: RE | Admit: 2020-09-09 | Discharge: 2020-09-09 | Disposition: A | Discharge status: Home / Self Care | Payer: 59 | Source: Ambulatory Visit | Attending: Obstetrics and Gynecology | Admitting: Obstetrics and Gynecology

## 2020-09-09 DIAGNOSIS — N644 Mastodynia: Secondary | ICD-10-CM

## 2021-02-01 ENCOUNTER — Ambulatory Visit (HOSPITAL_BASED_OUTPATIENT_CLINIC_OR_DEPARTMENT_OTHER)
Admission: RE | Admit: 2021-02-01 | Discharge: 2021-02-01 | Disposition: A | Discharge status: Home / Self Care | Payer: 59 | Source: Ambulatory Visit | Attending: Internal Medicine | Admitting: Internal Medicine

## 2021-02-01 ENCOUNTER — Other Ambulatory Visit (HOSPITAL_BASED_OUTPATIENT_CLINIC_OR_DEPARTMENT_OTHER): Payer: Self-pay | Admitting: Emergency Medicine

## 2021-02-01 ENCOUNTER — Other Ambulatory Visit: Payer: Self-pay

## 2021-02-01 DIAGNOSIS — M25512 Pain in left shoulder: Secondary | ICD-10-CM | POA: Insufficient documentation

## 2021-04-25 ENCOUNTER — Encounter: Payer: 59 | Admitting: Obstetrics and Gynecology

## 2021-04-25 NOTE — Progress Notes (Signed)
45 y.o. [name] Married [place] American female here for annual exam.    Would welcome pregnancy.   Father passed away in 04/18/18.  Patient is still grieving.  She has had counseling with Hospice in past.  I delivered her daughter 11 years ago.   Received her [place] booster.   PCP:  Donnetta Hutching, MD   Patient's last menstrual period was 04/13/2021 (exact date).     Period Cycle (Days): 30 Period Duration (Days): 5 Period Pattern: Regular Menstrual Flow: Moderate Menstrual Control: Maxi pad Menstrual Control Change Freq (Hours): changes pad every 4 hours on heaviest day Dysmenorrhea: None     Sexually active: Yes.    The current method of family planning is none.    Exercising: Yes.    walking Smoker:  no  Health Maintenance: Pap: 01-24-17 Neg:Neg HR HPV, 06-05-12 Neg:Neg HR HPV History of abnormal Pap:  no MMG:  09-09-20 Diag.Bil/Neg/Birads1/screening 24yr Colonoscopy:  NEVER BMD:   n/a  Result  n/a TDaP: PCP Gardasil:   no HIV:Neg in preg Hep C: unsure Screening Labs:  PCP   reports that she has never smoked. She has never used smokeless tobacco. She reports current alcohol use of about 1.0 standard drink of alcohol per week. She reports that she does not use drugs.  Past Medical History:  Diagnosis Date  . Anxiety   . Depression   . High cholesterol   . Hypertension   . Leiomyoma     Past Surgical History:  Procedure Laterality Date  . VAGINAL DELIVERY  2010/04/18  . WISDOM TOOTH EXTRACTION      Current Outpatient Medications  Medication Sig Dispense Refill  . Multiple Vitamins-Minerals (MULTIVITAMIN WITH MINERALS) tablet Take 1 tablet by mouth daily.    . traZODone (DESYREL) 100 MG tablet Take 100 mg by mouth at bedtime.    . TRINTELLIX 10 MG TABS tablet Take 10 mg by mouth daily.    Marland Kitchen gabapentin (NEURONTIN) 300 MG capsule Take 1 capsule by mouth at bedtime. (Patient not taking: Reported on 04/27/2021)     No current facility-administered medications for this visit.     Family History  Problem Relation Age of Onset  . Heart disease Father   . Hypertension Father   . Cancer Father 17       prostate,bladder  . Hypertension Mother   . Rheum arthritis Mother   . Thyroid disease Mother   . Breast cancer Maternal Grandmother 45  . Cancer Maternal Grandmother        BLADDER  . Breast cancer Paternal Grandmother        ? age  . Diabetes Maternal Aunt   . Hypertension Maternal Aunt   . Diabetes Maternal Uncle   . Hypertension Maternal Uncle   . Diabetes Paternal Aunt   . Hypertension Paternal Aunt   . Diabetes Paternal Uncle   . Hypertension Paternal Uncle     Review of Systems  All other systems reviewed and are negative.   Exam:   BP 118/84 (Cuff Size: Large)   Pulse 81   Ht 6' (1.829 m)   Wt 194 lb (88 kg)   LMP 04/13/2021 (Exact Date)   SpO2 100%   BMI 26.31 kg/m     General appearance: alert, cooperative and appears stated age Head: normocephalic, without obvious abnormality, atraumatic Neck: no adenopathy, supple, symmetrical, trachea midline and thyroid normal to inspection and palpation Lungs: clear to auscultation bilaterally Breasts: normal appearance, no masses or tenderness, No nipple retraction  or dimpling, No nipple discharge or bleeding, No axillary adenopathy Heart: regular rate and rhythm Abdomen: soft, non-tender; no masses, no organomegaly Extremities: extremities normal, atraumatic, no cyanosis or edema Skin: skin color, texture, turgor normal. No rashes or lesions Lymph nodes: cervical, supraclavicular, and axillary nodes normal. Neurologic: grossly normal  Pelvic: External genitalia:  no lesions              No abnormal inguinal nodes palpated.              Urethra:  normal appearing urethra with no masses, tenderness or lesions              Bartholins and Skenes: normal                 Vagina: normal appearing vagina with normal color and discharge, no lesions              Cervix: no lesions              Pap  taken: No. Bimanual Exam:  Uterus:  normal size, contour, position, consistency, mobility, non-tender              Adnexa: no mass, fullness, tenderness              Rectal exam: Yes.  .  Confirms.              Anus:  normal sphincter tone, no lesions  Chaperone was present for exam.  Assessment:   Well woman visit with normal exam. Bereavement.   Plan: Mammogram screening discussed. Self breast awareness reviewed. Pap and HR HPV 2023. Guidelines for Calcium, Vitamin D, regular exercise program including cardiovascular and weight bearing exercise. Switch MVI to PNV.  I encouraged her to reach out to Hospice for follow up counseling.  Follow up annually and prn.

## 2021-04-27 ENCOUNTER — Encounter: Payer: Self-pay | Admitting: Obstetrics and Gynecology

## 2021-04-27 ENCOUNTER — Other Ambulatory Visit: Payer: Self-pay

## 2021-04-27 ENCOUNTER — Ambulatory Visit (INDEPENDENT_AMBULATORY_CARE_PROVIDER_SITE_OTHER): Payer: 59 | Admitting: Obstetrics and Gynecology

## 2021-04-27 VITALS — BP 118/84 | HR 81 | Ht 72.0 in | Wt 194.0 lb

## 2021-04-27 DIAGNOSIS — Z01419 Encounter for gynecological examination (general) (routine) without abnormal findings: Secondary | ICD-10-CM

## 2021-04-27 NOTE — Patient Instructions (Signed)

## 2021-08-22 ENCOUNTER — Other Ambulatory Visit: Payer: Self-pay | Admitting: Obstetrics and Gynecology

## 2021-08-22 DIAGNOSIS — Z1231 Encounter for screening mammogram for malignant neoplasm of breast: Secondary | ICD-10-CM

## 2021-09-13 ENCOUNTER — Other Ambulatory Visit: Payer: Self-pay

## 2021-09-13 ENCOUNTER — Ambulatory Visit
Admission: RE | Admit: 2021-09-13 | Discharge: 2021-09-13 | Disposition: A | Discharge status: Home / Self Care | Payer: 59 | Source: Ambulatory Visit | Attending: Obstetrics and Gynecology | Admitting: Obstetrics and Gynecology

## 2021-09-13 DIAGNOSIS — Z1231 Encounter for screening mammogram for malignant neoplasm of breast: Secondary | ICD-10-CM

## 2022-03-22 IMAGING — DX DG SHOULDER 2+V*L*
3 series · 4 of 4 positions shown · non-contrast
Comparison: None.

CLINICAL DATA: Left shoulder pain

EXAM:
LEFT SHOULDER - 2+ VIEW

[shoulder grashey]
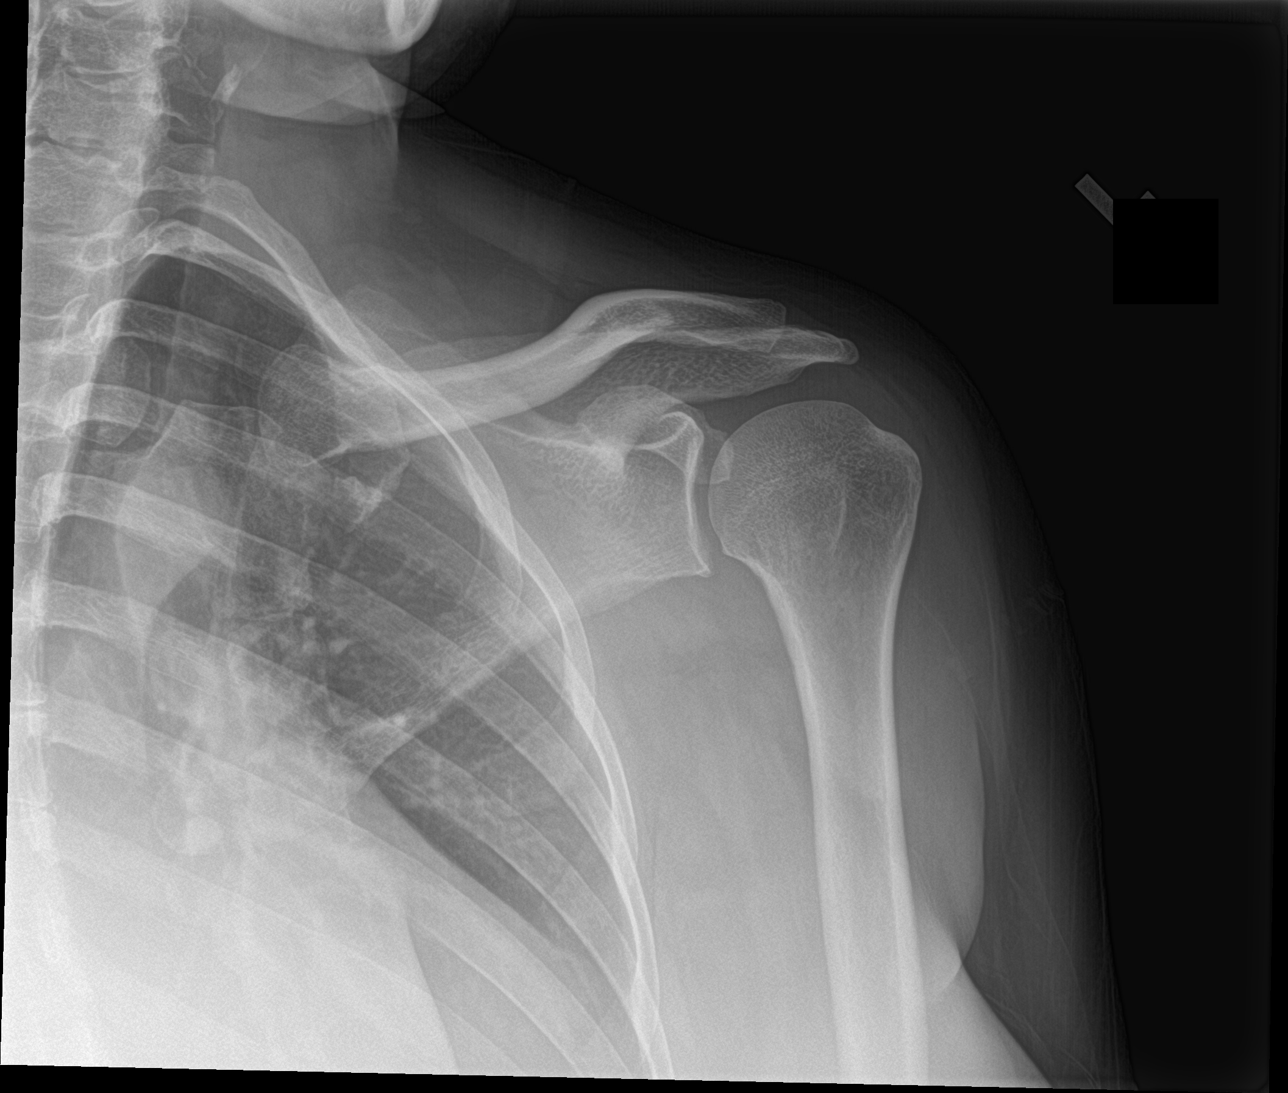

[Series 2: shoulder y view · 0.14mm/px · 2 of 2 slices shown]
[im 1/2]
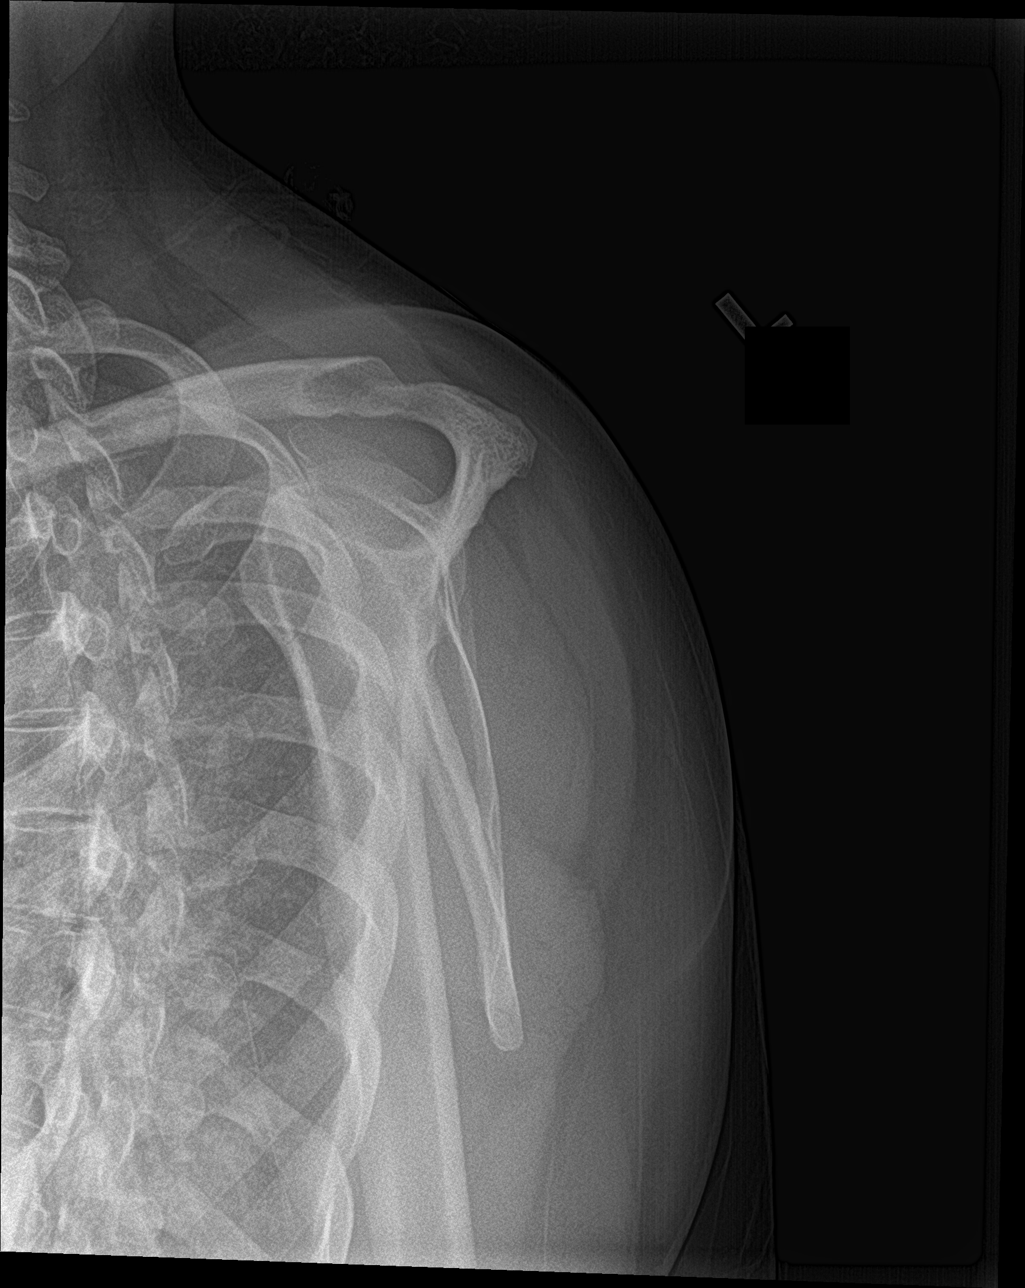
[im 2/2]
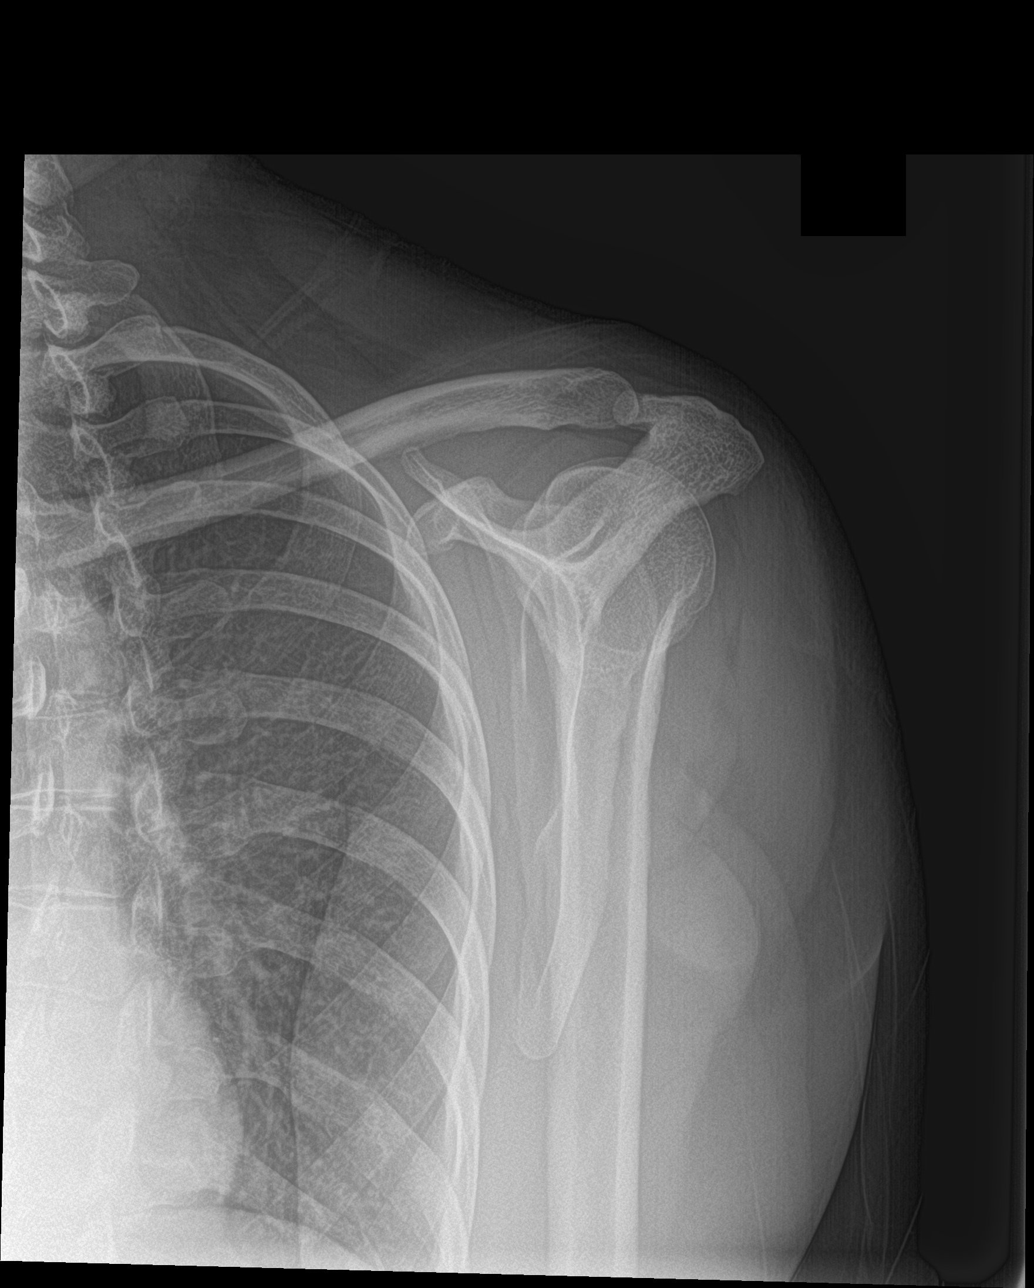

[shoulder axillary]
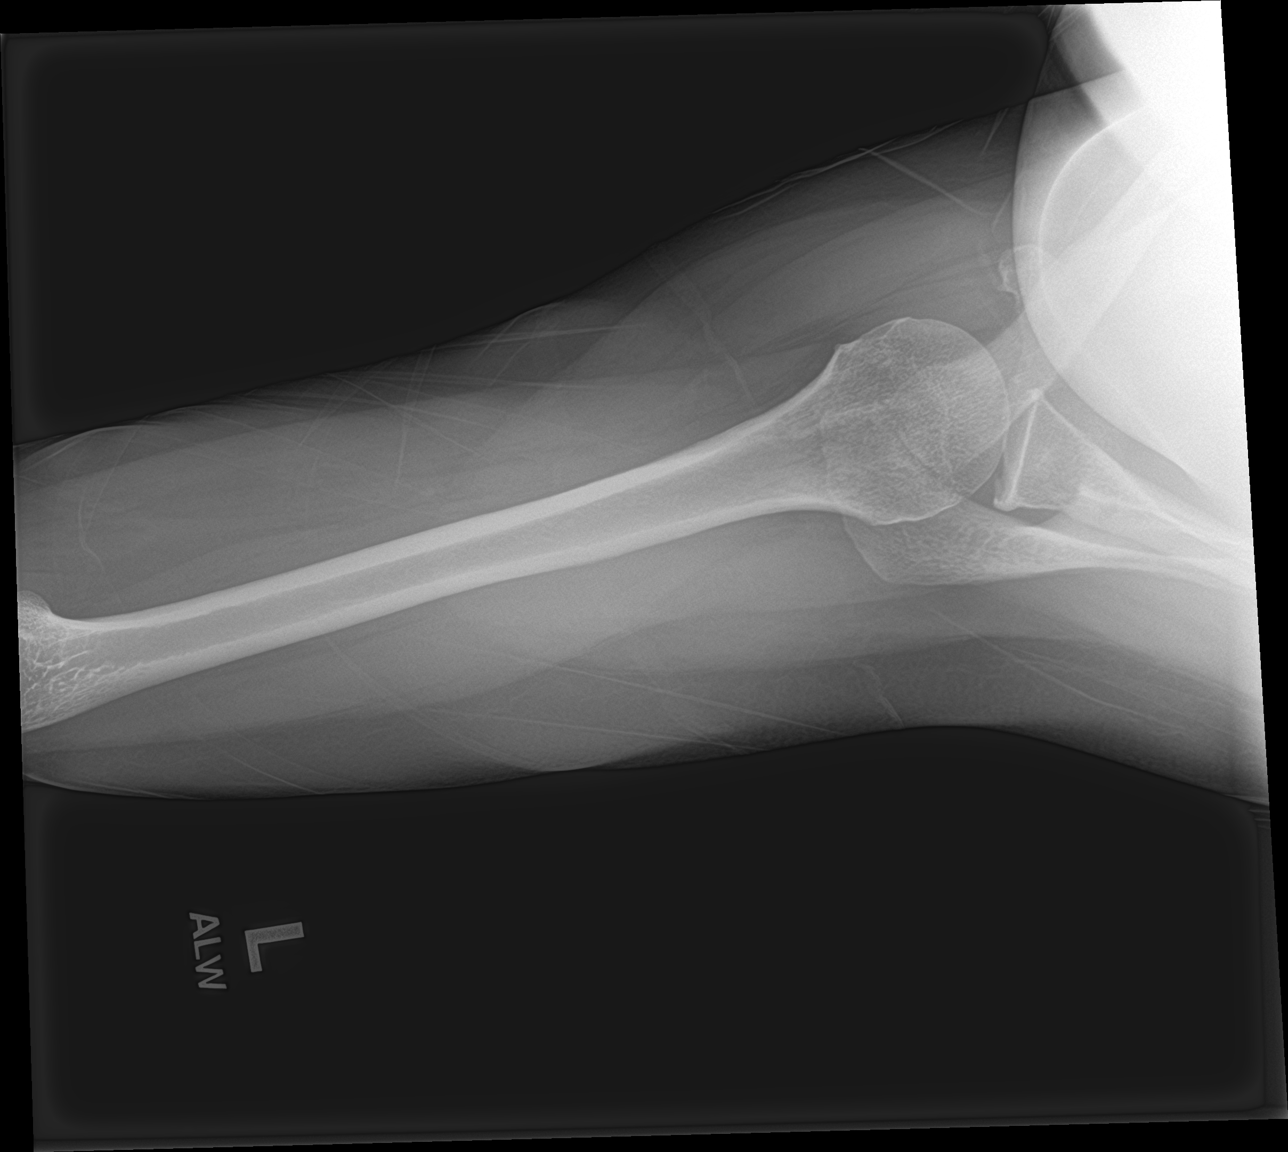

[4 of 4 positions shown; findings below may reference images not displayed]

FINDINGS: No fracture or dislocation is seen.

The joint spaces are preserved.

The visualized soft tissues are unremarkable.

Visualized left lung is clear.
IMPRESSION: Negative.

## 2022-08-09 ENCOUNTER — Other Ambulatory Visit: Payer: Self-pay | Admitting: Obstetrics and Gynecology

## 2022-08-09 DIAGNOSIS — Z1231 Encounter for screening mammogram for malignant neoplasm of breast: Secondary | ICD-10-CM

## 2022-08-29 ENCOUNTER — Ambulatory Visit: Payer: 59 | Admitting: Obstetrics and Gynecology

## 2022-09-11 NOTE — Progress Notes (Signed)
46 y.o. [name] Married [place] American female here for annual exam.   Complains of bilateral ankle swelling x's 2 months. No shortness of breath unless going up stairs.  Having hot flashes since she was 46 yo. Having more frequency night sweats.   Works from home.   PCP:   Donnetta Hutching, MD  LMP: 08/28/22     Period Cycle (Days): 28 Period Duration (Days): 5 Period Pattern: Regular Menstrual Flow: Moderate Menstrual Control: Maxi pad Dysmenorrhea: (!) Mild (intermittent) Dysmenorrhea Symptoms: Cramping     Sexually active: Yes.    The current method of family planning is none.   Would welcome pregnancy.  Exercising: Yes.    Under the desk treadmill daily. Smoker:  no  Health Maintenance: Pap:  01-24-17 Neg:Neg HR HPV, 06-05-12 Neg:Neg HR HPV History of abnormal Pap:  no MMG:  09-13-21 Neg/BiRads1.  Appt. 09-14-22 Colonoscopy:  November, 2022.  Normal.  Due in 10 years.  BMD:   n/a  Result  n/a TDaP:  PCP Gardasil:   no HIV: Neg in past Hep C: Unsure Screening Labs:  Hb today: n/a, Urine today: n/a   reports that she has never smoked. She has never used smokeless tobacco. She reports current alcohol use of about 2.0 standard drinks of alcohol per week. She reports that she does not use drugs.  Past Medical History:  Diagnosis Date   Anxiety    Depression    High cholesterol    Hypertension    Leiomyoma     Past Surgical History:  Procedure Laterality Date   VAGINAL DELIVERY  2011   WISDOM TOOTH EXTRACTION      Current Outpatient Medications  Medication Sig Dispense Refill   clonazePAM (KLONOPIN) 0.5 MG tablet TAKE 1 TABLET BY MOUTH ONE TO TWO TIMES A DAY AS NEEDED FOR SEVERE ANXIETY     Multiple Vitamins-Minerals (MULTIVITAMIN WITH MINERALS) tablet Take 1 tablet by mouth daily.     traZODone (DESYREL) 100 MG tablet Take 100 mg by mouth at bedtime.     TRINTELLIX 10 MG TABS tablet Take 10 mg by mouth daily.     No current facility-administered medications for this  visit.    Family History  Problem Relation Age of Onset   Heart disease Father    Hypertension Father    Cancer Father 51       prostate,bladder   Hypertension Mother    Rheum arthritis Mother    Thyroid disease Mother    Breast cancer Maternal Grandmother 14   Cancer Maternal Grandmother        BLADDER   Breast cancer Paternal Grandmother        ? age   Diabetes Maternal Aunt    Hypertension Maternal Aunt    Diabetes Maternal Uncle    Hypertension Maternal Uncle    Diabetes Paternal Aunt    Hypertension Paternal Aunt    Diabetes Paternal Uncle    Hypertension Paternal Uncle     Review of Systems  All other systems reviewed and are negative.   Exam:   BP 104/70 (BP Location: Left Arm, Patient Position: Sitting, Cuff Size: Large)   Ht 5' 4.75" (1.645 m)   Wt 204 lb (92.5 kg)   LMP 08/28/2022 (Exact Date)   BMI 34.21 kg/m     General appearance: alert, cooperative and appears stated age Head: normocephalic, without obvious abnormality, atraumatic Neck: no adenopathy, supple, symmetrical, trachea midline and thyroid normal to inspection and palpation Lungs: clear to auscultation  bilaterally Breasts: normal appearance, no masses or tenderness, No nipple retraction or dimpling, No nipple discharge or bleeding, No axillary adenopathy Heart: regular rate and rhythm Abdomen: soft, non-tender; no masses, no organomegaly Extremities: extremities normal, atraumatic, no cyanosis.  Minimal bilateral LE edema.  Skin: skin color, texture, turgor normal. No rashes or lesions Lymph nodes: cervical, supraclavicular, and axillary nodes normal. Neurologic: grossly normal  Pelvic: External genitalia:  no lesions              No abnormal inguinal nodes palpated.              Urethra:  normal appearing urethra with no masses, tenderness or lesions              Bartholins and Skenes: normal                 Vagina: normal appearing vagina with normal color and discharge, no lesions               Cervix: no lesions              Pap taken: Yes.   Bimanual Exam:  Uterus:  normal size, 1.5 cm anterior LUS fibroid notd.               Adnexa: no mass, fullness, tenderness              Rectal exam: Yes.  .  Confirms.              Anus:  normal sphincter tone, no lesions  Chaperone was present for exam:  yes.  Assessment:   Well woman visit with gynecologic exam. Cervical cancer screening.  Hx fibroids.  Mild LE edema.  Plan: Mammogram screening discussed. Self breast awareness reviewed. Pap and HR HPV collected. Guidelines for Calcium, Vitamin D, regular exercise program including cardiovascular and weight bearing exercise. Stop MVI and start PNV. Flu and Covid vaccines discussed. We discussed compression hose, decreasing sodium in her diet, raising her legs while at rest, and seeing her PCP for further evaluation and possible treatment. Follow up annually and prn.   After visit summary provided.

## 2022-09-13 ENCOUNTER — Other Ambulatory Visit (HOSPITAL_COMMUNITY)
Admission: RE | Admit: 2022-09-13 | Discharge: 2022-09-13 | Disposition: A | Discharge status: Home / Self Care | Payer: 59 | Source: Ambulatory Visit | Attending: Obstetrics and Gynecology | Admitting: Obstetrics and Gynecology

## 2022-09-13 ENCOUNTER — Ambulatory Visit (INDEPENDENT_AMBULATORY_CARE_PROVIDER_SITE_OTHER): Payer: 59 | Admitting: Obstetrics and Gynecology

## 2022-09-13 ENCOUNTER — Encounter: Payer: Self-pay | Admitting: Obstetrics and Gynecology

## 2022-09-13 VITALS — BP 104/70 | Ht 64.75 in | Wt 204.0 lb

## 2022-09-13 DIAGNOSIS — Z124 Encounter for screening for malignant neoplasm of cervix: Secondary | ICD-10-CM | POA: Diagnosis not present

## 2022-09-13 DIAGNOSIS — Z01419 Encounter for gynecological examination (general) (routine) without abnormal findings: Secondary | ICD-10-CM

## 2022-09-13 NOTE — Patient Instructions (Signed)

## 2022-09-14 ENCOUNTER — Ambulatory Visit
Admission: RE | Admit: 2022-09-14 | Discharge: 2022-09-14 | Disposition: A | Discharge status: Home / Self Care | Payer: 59 | Source: Ambulatory Visit | Attending: Obstetrics and Gynecology | Admitting: Obstetrics and Gynecology

## 2022-09-14 DIAGNOSIS — Z1231 Encounter for screening mammogram for malignant neoplasm of breast: Secondary | ICD-10-CM

## 2022-09-18 LAB — CYTOLOGY - PAP
Comment: NEGATIVE
Diagnosis: NEGATIVE
High risk HPV: NEGATIVE

## 2023-08-27 ENCOUNTER — Other Ambulatory Visit: Payer: Self-pay | Admitting: Internal Medicine

## 2023-08-27 DIAGNOSIS — Z1231 Encounter for screening mammogram for malignant neoplasm of breast: Secondary | ICD-10-CM

## 2023-09-16 ENCOUNTER — Ambulatory Visit
Admission: RE | Admit: 2023-09-16 | Discharge: 2023-09-16 | Disposition: A | Discharge status: Home / Self Care | Payer: 59 | Source: Ambulatory Visit | Attending: Internal Medicine | Admitting: Internal Medicine

## 2023-09-16 DIAGNOSIS — Z1231 Encounter for screening mammogram for malignant neoplasm of breast: Secondary | ICD-10-CM

## 2023-09-23 ENCOUNTER — Encounter: Payer: Self-pay | Admitting: Obstetrics and Gynecology

## 2023-09-23 ENCOUNTER — Ambulatory Visit: Payer: 59 | Admitting: Obstetrics and Gynecology

## 2023-09-24 ENCOUNTER — Ambulatory Visit (INDEPENDENT_AMBULATORY_CARE_PROVIDER_SITE_OTHER): Payer: 59 | Admitting: Obstetrics and Gynecology

## 2023-09-24 ENCOUNTER — Encounter: Payer: Self-pay | Admitting: Obstetrics and Gynecology

## 2023-09-24 VITALS — BP 112/80 | HR 78 | Ht 65.0 in | Wt 209.0 lb

## 2023-09-24 DIAGNOSIS — Z01419 Encounter for gynecological examination (general) (routine) without abnormal findings: Secondary | ICD-10-CM | POA: Insufficient documentation

## 2023-09-24 DIAGNOSIS — Z124 Encounter for screening for malignant neoplasm of cervix: Secondary | ICD-10-CM | POA: Insufficient documentation

## 2023-09-24 DIAGNOSIS — N951 Menopausal and female climacteric states: Secondary | ICD-10-CM | POA: Insufficient documentation

## 2023-09-24 DIAGNOSIS — D219 Benign neoplasm of connective and other soft tissue, unspecified: Secondary | ICD-10-CM | POA: Insufficient documentation

## 2023-09-24 NOTE — Assessment & Plan Note (Signed)
Discussed perimenopause sx. Briefly discussed management of VMS, including use of HRT, SSRI, and veozah. Also discussed CBT and meditation as lifestyle modifications that can improve hot flashes. Information for insighttimer, a mediation app provided. Patient wants to continue expectant management at this time.

## 2023-09-24 NOTE — Assessment & Plan Note (Signed)
Noted in 2013 on TVUS/SIS Asymptomatic Continue exp management

## 2023-09-24 NOTE — Patient Instructions (Addendum)
Discussed that behavioral therapy and meditation as lifestyle modifications that can improve hot flashes.  Consider using insighttimer, a mediation app, for guided meditation.  Health Maintenance, Female Adopting a healthy lifestyle and getting preventive care are important in promoting health and wellness. Ask your health care provider about: The right schedule for you to have regular tests and exams. Things you can do on your own to prevent diseases and keep yourself healthy. What should I know about diet, weight, and exercise? Eat a healthy diet  Eat a diet that includes plenty of vegetables, fruits, low-fat dairy products, and lean protein. Do not eat a lot of foods that are high in solid fats, added sugars, or sodium. Maintain a healthy weight Body mass index (BMI) is used to identify weight problems. It estimates body fat based on height and weight. Your health care provider can help determine your BMI and help you achieve or maintain a healthy weight. Get regular exercise Get regular exercise. This is one of the most important things you can do for your health. Most adults should: Exercise for at least 150 minutes each week. The exercise should increase your heart rate and make you sweat (moderate-intensity exercise). Do strengthening exercises at least twice a week. This is in addition to the moderate-intensity exercise. Spend less time sitting. Even light physical activity can be beneficial. Watch cholesterol and blood lipids Have your blood tested for lipids and cholesterol at 47 years of age, then have this test every 5 years. Have your cholesterol levels checked more often if: Your lipid or cholesterol levels are high. You are older than 47 years of age. You are at high risk for heart disease. What should I know about cancer screening? Depending on your health history and family history, you may need to have cancer screening at various ages. This may include screening  for: Breast cancer. Cervical cancer. Colorectal cancer. Skin cancer. Lung cancer. What should I know about heart disease, diabetes, and high blood pressure? Blood pressure and heart disease High blood pressure causes heart disease and increases the risk of stroke. This is more likely to develop in people who have high blood pressure readings or are overweight. Have your blood pressure checked: Every 3-5 years if you are 74-54 years of age. Every year if you are 86 years old or older. Diabetes Have regular diabetes screenings. This checks your fasting blood sugar level. Have the screening done: Once every three years after age 18 if you are at a normal weight and have a low risk for diabetes. More often and at a younger age if you are overweight or have a high risk for diabetes. What should I know about preventing infection? Hepatitis B If you have a higher risk for hepatitis B, you should be screened for this virus. Talk with your health care provider to find out if you are at risk for hepatitis B infection. Hepatitis C Testing is recommended for: Everyone born from [birth year] through 1965. Anyone with known risk factors for hepatitis C. Sexually transmitted infections (STIs) Get screened for STIs, including gonorrhea and chlamydia, if: You are sexually active and are younger than 47 years of age. You are older than 47 years of age and your health care provider tells you that you are at risk for this type of infection. Your sexual activity has changed since you were last screened, and you are at increased risk for chlamydia or gonorrhea. Ask your health care provider if you are at risk. Ask your  health care provider about whether you are at high risk for HIV. Your health care provider may recommend a prescription medicine to help prevent HIV infection. If you choose to take medicine to prevent HIV, you should first get tested for HIV. You should then be tested every 3 months for as long as you  are taking the medicine. Osteoporosis and menopause Osteoporosis is a disease in which the bones lose minerals and strength with aging. This can result in bone fractures. If you are 36 years old or older, or if you are at risk for osteoporosis and fractures, ask your health care provider if you should: Be screened for bone loss. Take a calcium or vitamin D supplement to lower your risk of fractures. Be given hormone replacement therapy (HRT) to treat symptoms of menopause. Follow these instructions at home: Alcohol use Do not drink alcohol if: Your health care provider tells you not to drink. You are pregnant, may be pregnant, or are planning to become pregnant. If you drink alcohol: Limit how much you have to: 0-1 drink a day. Know how much alcohol is in your drink. In the [place] one drink equals one 12 oz bottle of beer (355 mL), one 5 oz glass of wine (148 mL), or one 1 oz glass of hard liquor (44 mL). Lifestyle Do not use any products that contain nicotine or tobacco. These products include cigarettes, chewing tobacco, and vaping devices, such as e-cigarettes. If you need help quitting, ask your health care provider. Do not use street drugs. Do not share needles. Ask your health care provider for help if you need support or information about quitting drugs. General instructions Schedule regular health, dental, and eye exams. Stay current with your vaccines. Tell your health care provider if: You often feel depressed. You have ever been abused or do not feel safe at home. Summary Adopting a healthy lifestyle and getting preventive care are important in promoting health and wellness. Follow your health care provider's instructions about healthy diet, exercising, and getting tested or screened for diseases. Follow your health care provider's instructions on monitoring your cholesterol and blood pressure. This information is not intended to replace advice given to you by your health  care provider. Make sure you discuss any questions you have with your health care provider. Document Revised: 04/24/2021 Document Reviewed: 04/24/2021 Elsevier Patient Education  2024 [employer].

## 2023-09-24 NOTE — Assessment & Plan Note (Signed)
Cervical cancer screening performed according to ASCCP guidelines. Encouraged annual mammogram screening Colonoscopy UTD DXA NA Labs and immunizations with her primary Encouraged safe sexual practices as indicated Encouraged healthy lifestyle practices with diet and exercise

## 2023-09-24 NOTE — Progress Notes (Signed)
47 y.o. [name] female here for annual exam. Married. Works from home. Notes increased night sweats and irritability. Notes chronic sleep interruptions. Only uses it occasionally with extreme fatigue.  Patient's last menstrual period was 08/30/2023 (exact date). Period Cycle (Days):  (28-49) Period Duration (Days): 5-7 Menstrual Flow:  (mod to heavy) Menstrual Control: Maxi pad Dysmenorrhea: None  Abnormal bleeding: none Pelvic discharge or pain: none Breast mass, nipple discharge or skin changes : none Birth control: none Last PAP:     Component Value Date/Time   DIAGPAP  09/13/2022 1503    - Negative for intraepithelial lesion or malignancy (NILM)   HPVHIGH Negative 09/13/2022 1503   ADEQPAP  09/13/2022 1503    Satisfactory for evaluation; transformation zone component PRESENT.   MMG: 09/16/2023-neg birads 1, density B Colonoscopy: Nov 2022, q39yr Sexually active: yes  Exercising: Works from home, uses under desk walker 3 days a week, 15-30 mins.  GYN HISTORY: Fibroids, asymptomatic  OB History  Gravida Para Term Preterm AB Living  1 1       1   SAB IAB Ectopic Multiple Live Births               # Outcome Date GA Lbr Len/2nd Weight Sex Type Anes PTL Lv  1 Para             Past Medical History:  Diagnosis Date   Anxiety    Depression    High cholesterol    Hypertension    Leiomyoma     Past Surgical History:  Procedure Laterality Date   VAGINAL DELIVERY  2011   WISDOM TOOTH EXTRACTION      Current Outpatient Medications on File Prior to Visit  Medication Sig Dispense Refill   clonazePAM (KLONOPIN) 0.5 MG tablet TAKE 1 TABLET BY MOUTH ONE TO TWO TIMES A DAY AS NEEDED FOR SEVERE ANXIETY     Multiple Vitamins-Minerals (MULTIVITAMIN WITH MINERALS) tablet Take 1 tablet by mouth daily.     traZODone (DESYREL) 100 MG tablet Take 100 mg by mouth at bedtime.     TRINTELLIX 10 MG TABS tablet Take 10 mg by mouth daily.     [DISCONTINUED] Escitalopram Oxalate  (LEXAPRO PO) Take by mouth.     No current facility-administered medications on file prior to visit.    Social History   Socioeconomic History   Marital status: Married    Spouse name: Onalee Hua   Number of children: 1   Years of education: Not on file   Highest education level: Not on file  Occupational History   Occupation: Doctor, general practice    Employer: [employer]  Tobacco Use   Smoking status: Never   Smokeless tobacco: Never  Vaping Use   Vaping status: Never Used  Substance and Sexual Activity   Alcohol use: Yes    Alcohol/week: 2.0 standard drinks of alcohol    Types: 2 Glasses of wine per week    Comment: occasionally   Drug use: No   Sexual activity: Yes    Partners: Male    Birth control/protection: None    Comment: First IC >16y/o, >5 Partners  Other Topics Concern   Not on file  Social History Narrative   Lives with her husband and their daughter.   Social Determinants of Health   Financial Resource Strain: Not on file  Food Insecurity: Not on file  Transportation Needs: Not on file  Physical Activity: Not on file  Stress: Not on file  Social Connections: Not on  file  Intimate Partner Violence: Not on file    Family History  Problem Relation Age of Onset   Hypertension Mother    Rheum arthritis Mother    Thyroid disease Mother    Heart disease Father    Hypertension Father    Cancer Father 68       prostate,bladder   Diabetes Maternal Aunt    Hypertension Maternal Aunt    Diabetes Maternal Uncle    Hypertension Maternal Uncle    Diabetes Paternal Aunt    Hypertension Paternal Aunt    Diabetes Paternal Uncle    Hypertension Paternal Uncle    Breast cancer Maternal Grandmother 62   Cancer Maternal Grandmother        BLADDER   Breast cancer Paternal Grandmother        ? age   Breast cancer Cousin     No Known Allergies    PE Today's Vitals   09/24/23 1119  BP: 112/80  Pulse: 78  SpO2: 98%  Weight: 209 lb (94.8 kg)  Height: 5\' 5"   (1.651 m)   Body mass index is 34.78 kg/m.  Physical Exam Vitals reviewed. Exam conducted with a chaperone present.  Constitutional:      General: She is not in acute distress.    Appearance: Normal appearance.  HENT:     Head: Normocephalic and atraumatic.     Nose: Nose normal.  Eyes:     Extraocular Movements: Extraocular movements intact.     Conjunctiva/sclera: Conjunctivae normal.  Neck:     Thyroid: No thyroid mass, thyromegaly or thyroid tenderness.  Pulmonary:     Effort: Pulmonary effort is normal.  Chest:     Chest wall: No mass or tenderness.  Breasts:    Right: Normal. No swelling, mass, nipple discharge, skin change or tenderness.     Left: Normal. No swelling, mass, nipple discharge, skin change or tenderness.  Abdominal:     General: There is no distension.     Palpations: Abdomen is soft.     Tenderness: There is no abdominal tenderness.  Genitourinary:    General: Normal vulva.     Exam position: Lithotomy position.     Urethra: No prolapse.     Vagina: Normal. No vaginal discharge or bleeding.     Cervix: Normal. No lesion.     Uterus: Normal. Not enlarged and not tender.      Adnexa: Right adnexa normal and left adnexa normal.     Comments: Uterus with globular shape Musculoskeletal:        General: Normal range of motion.     Cervical back: Normal range of motion.  Lymphadenopathy:     Upper Body:     Right upper body: No axillary adenopathy.     Left upper body: No axillary adenopathy.     Lower Body: No right inguinal adenopathy. No left inguinal adenopathy.  Skin:    General: Skin is warm and dry.  Neurological:     General: No focal deficit present.     Mental Status: She is alert.  Psychiatric:        Mood and Affect: Mood normal.        Behavior: Behavior normal.       Assessment and Plan:        Well woman exam with routine gynecological exam Assessment & Plan: Cervical cancer screening performed according to ASCCP  guidelines. Encouraged annual mammogram screening Colonoscopy UTD DXA NA Labs and immunizations with her  primary Encouraged safe sexual practices as indicated Encouraged healthy lifestyle practices with diet and exercise    Fibroids Assessment & Plan: Noted in 2013 on TVUS/SIS Asymptomatic Continue exp management   Perimenopause Assessment & Plan: Discussed perimenopause sx. Briefly discussed management of VMS, including use of HRT, SSRI, and veozah. Also discussed CBT and meditation as lifestyle modifications that can improve hot flashes. Information for insighttimer, a mediation app provided. Patient wants to continue expectant management at this time.     Rosalyn Gess, MD

## 2023-11-30 ENCOUNTER — Encounter (HOSPITAL_COMMUNITY): Payer: Self-pay | Admitting: Emergency Medicine

## 2023-11-30 ENCOUNTER — Other Ambulatory Visit: Payer: Self-pay

## 2023-11-30 ENCOUNTER — Ambulatory Visit (HOSPITAL_COMMUNITY)
Admission: EM | Admit: 2023-11-30 | Discharge: 2023-11-30 | Disposition: A | Discharge status: Home / Self Care | Payer: 59 | Attending: Emergency Medicine | Admitting: Emergency Medicine

## 2023-11-30 DIAGNOSIS — J069 Acute upper respiratory infection, unspecified: Secondary | ICD-10-CM | POA: Diagnosis not present

## 2023-11-30 DIAGNOSIS — J40 Bronchitis, not specified as acute or chronic: Secondary | ICD-10-CM | POA: Diagnosis not present

## 2023-11-30 MED ORDER — PROMETHAZINE-DM 6.25-15 MG/5ML PO SYRP
5.0000 mL | ORAL_SOLUTION | Freq: Four times a day (QID) | ORAL | 0 refills | Status: DC | PRN
Start: 2023-11-30 — End: 2024-03-23

## 2023-11-30 MED ORDER — FLUTICASONE PROPIONATE 50 MCG/ACT NA SUSP: 1.0000 | Freq: Two times a day (BID) | NASAL | 1 refills | Status: DC | PRN

## 2023-11-30 MED ORDER — AZITHROMYCIN 250 MG PO TABS: ORAL_TABLET | ORAL | 0 refills | Status: DC

## 2023-11-30 NOTE — ED Triage Notes (Signed)
Patient is c/o persistent cough, fatigue and chest congestion for over a week no getting better with OTC medication.

## 2023-11-30 NOTE — ED Provider Notes (Signed)
[facility]    CSN: [id number] Arrival date & time: 11/30/23  1118      History   Chief Complaint Chief Complaint  Patient presents with   URI    HPI Tammy Escobar is a 47 y.o. female.   Patient is presenting for cold flulike symptoms.  She started feeling bad approximately 1 week ago with slight cough and congestion on Thursday fever started running 100.  She now has worsening headache body aches and cough.  Cough is productive at times.  The history is provided by the patient.  URI Presenting symptoms: congestion, cough and fever   Congestion:    Location:  Nasal Cough:    Cough characteristics:  Productive and barking   Sputum characteristics:  Yellow   Severity:  Moderate   Onset quality:  Gradual   Duration:  1 week   Timing:  Constant   Progression:  Worsening Severity:  Moderate Duration:  3 days Timing:  Intermittent Progression:  Waxing and waning Chronicity:  New Associated symptoms: arthralgias, headaches and swollen glands     Past Medical History:  Diagnosis Date   Anxiety    Depression    High cholesterol    Hypertension    Leiomyoma     Patient Active Problem List   Diagnosis Date Noted   Well woman exam with routine gynecological exam 09/24/2023   Fibroids 09/24/2023   Perimenopause 09/24/2023    Past Surgical History:  Procedure Laterality Date   VAGINAL DELIVERY  2011   WISDOM TOOTH EXTRACTION      OB History     Gravida  1   Para  1   Term      Preterm      AB      Living  1      SAB      IAB      Ectopic      Multiple      Live Births               Home Medications    Prior to Admission medications   Medication Sig Start Date End Date Taking? Authorizing Provider  azithromycin (ZITHROMAX Z-PAK) 250 MG tablet 2 tabs on day 1 then 1 tab for the next 4 days. 11/30/23  Yes Donalee Gaumond, Linde Gillis, NP  fluticasone (FLONASE) 50 MCG/ACT nasal spray Place 1 spray into both nostrils 2 (two)  times daily as needed for rhinitis. 11/30/23  Yes Natasa Stigall, Linde Gillis, NP  promethazine-dextromethorphan (PROMETHAZINE-DM) 6.25-15 MG/5ML syrup Take 5 mLs by mouth 4 (four) times daily as needed for cough. 11/30/23  Yes Shantera Monts, Linde Gillis, NP  clonazePAM (KLONOPIN) 0.5 MG tablet TAKE 1 TABLET BY MOUTH ONE TO TWO TIMES A DAY AS NEEDED FOR SEVERE ANXIETY    [provider]  Multiple Vitamins-Minerals (MULTIVITAMIN WITH MINERALS) tablet Take 1 tablet by mouth daily.    [provider]  traZODone (DESYREL) 100 MG tablet Take 100 mg by mouth at bedtime.    [provider]  TRINTELLIX 10 MG TABS tablet Take 10 mg by mouth daily. 05/28/20   [provider]  Escitalopram Oxalate (LEXAPRO PO) Take by mouth.  05/30/20  [provider]    Family History Family History  Problem Relation Age of Onset   Hypertension Mother    Rheum arthritis Mother    Thyroid disease Mother    Heart disease Father    Hypertension Father    Cancer Father 12  prostate,bladder   Diabetes Maternal Aunt    Hypertension Maternal Aunt    Diabetes Maternal Uncle    Hypertension Maternal Uncle    Diabetes Paternal Aunt    Hypertension Paternal Aunt    Diabetes Paternal Uncle    Hypertension Paternal Uncle    Breast cancer Maternal Grandmother 31   Cancer Maternal Grandmother        BLADDER   Breast cancer Paternal Grandmother        ? age   Breast cancer Cousin     Social History Social History   Tobacco Use   Smoking status: Never   Smokeless tobacco: Never  Vaping Use   Vaping status: Never Used  Substance Use Topics   Alcohol use: Yes    Alcohol/week: 2.0 standard drinks of alcohol    Types: 2 Glasses of wine per week    Comment: occasionally   Drug use: No     Allergies   Patient has no known allergies.   Review of Systems Review of Systems  Constitutional:  Positive for fever.  HENT:  Positive for congestion and postnasal drip.   Respiratory:   Positive for cough.   Musculoskeletal:  Positive for arthralgias.  Neurological:  Positive for headaches.     Physical Exam Triage Vital Signs ED Triage Vitals  Encounter Vitals Group     BP 11/30/23 1222 139/88     Systolic BP Percentile --      Diastolic BP Percentile --      Pulse Rate 11/30/23 1222 80     Resp 11/30/23 1222 18     Temp 11/30/23 1222 99 F (37.2 C)     Temp Source 11/30/23 1222 Oral     SpO2 11/30/23 1222 98 %     Weight --      Height --      Head Circumference --      Peak Flow --      Pain Score 11/30/23 1223 0     Pain Loc --      Pain Education --      Exclude from Growth Chart --    No data found.  Updated Vital Signs BP 139/88 (BP Location: Right Arm)   Pulse 80   Temp 99 F (37.2 C) (Oral)   Resp 18   LMP 11/21/2023   SpO2 98%   Visual Acuity Right Eye Distance:   Left Eye Distance:   Bilateral Distance:    Right Eye Near:   Left Eye Near:    Bilateral Near:     Physical Exam Constitutional:      Appearance: She is ill-appearing.  HENT:     Right Ear: There is impacted cerumen.     Left Ear: There is impacted cerumen.     Nose: Congestion and rhinorrhea present.     Right Turbinates: Swollen.     Left Turbinates: Swollen.     Mouth/Throat:     Mouth: Mucous membranes are moist.     Pharynx: Posterior oropharyngeal erythema present.  Pulmonary:     Breath sounds: Examination of the right-upper field reveals rhonchi. Examination of the left-upper field reveals rhonchi. Rhonchi present.  Lymphadenopathy:     Cervical: Cervical adenopathy present.      UC Treatments / Results  Labs (all labs ordered are listed, but only abnormal results are displayed) Labs Reviewed - No data to display  EKG   Radiology No results found.  Procedures Procedures (including critical care time)  Medications Ordered in UC Medications - No data to display  Initial Impression / Assessment and Plan / UC Course  I have reviewed the  triage vital signs and the nursing notes.  Pertinent labs & imaging results that were available during my care of the patient were reviewed by me and considered in my medical decision making (see chart for details).   Symptoms of a respiratory infection that have progressed to infective bronchitis.  She has had new onset of fever over the last 3 days.  Cough has worsened.  We will treat with antibiotics and over-the-counter medications.  She is advised to follow-up with the PCP as needed..   Final Clinical Impressions(s) / UC Diagnoses   Final diagnoses:  Acute upper respiratory infection  Bronchitis     Discharge Instructions      Over-the-counter medications include Mucinex 600 mg 3 times a day, antihistamine of choice 1 tablet daily,  zinc daily, 3000 mg of vitamin C daily. Cough syrup up to 4 times a day may cause drowsiness.   Azithromycin antibiotic 2 tabs today then 1 tab until completed. Flonase 1 spray each nostril twice a day for at least the next week.     ED Prescriptions     Medication Sig Dispense Auth. Provider   azithromycin (ZITHROMAX Z-PAK) 250 MG tablet 2 tabs on day 1 then 1 tab for the next 4 days. 6 tablet Haruto Demaria, Linde Gillis, NP   promethazine-dextromethorphan (PROMETHAZINE-DM) 6.25-15 MG/5ML syrup Take 5 mLs by mouth 4 (four) times daily as needed for cough. 118 mL Leannah Guse M, NP   fluticasone (FLONASE) 50 MCG/ACT nasal spray Place 1 spray into both nostrils 2 (two) times daily as needed for rhinitis. 18.2 mL Keilany Burnette, Linde Gillis, NP      PDMP not reviewed this encounter.   Nelda Marseille, NP 11/30/23 1312

## 2023-11-30 NOTE — Discharge Instructions (Addendum)
Over-the-counter medications include Mucinex 600 mg 3 times a day, antihistamine of choice 1 tablet daily,  zinc daily, 3000 mg of vitamin C daily. Cough syrup up to 4 times a day may cause drowsiness.   Azithromycin antibiotic 2 tabs today then 1 tab until completed. Flonase 1 spray each nostril twice a day for at least the next week.

## 2024-02-14 ENCOUNTER — Ambulatory Visit: Payer: Self-pay | Admitting: Internal Medicine

## 2024-02-14 NOTE — Telephone Encounter (Signed)
 Copied from CRM [id number]. Topic: Clinical - Red Word Triage >> Feb 14, 2024 11:27 AM Everette Rank wrote: Red Word that prompted transfer to Nurse Triage: Patient stated since 02/24  Stomach issue /Blood in Stool, Can not tranfers to Nurse line due to phone issue. CB [phone]   Chief Complaint: Rectal bleeding  Symptoms: Resolved at this time  Pertinent Negatives: Patient denies any current symptom  Disposition: [] ED /[] Urgent Care (no appt availability in office) / [] Appointment(In office/virtual)/ []  [facility]/ [x] Home Care/ [] Refused Recommended Disposition /[] [facility]/ []  Follow-up with PCP Additional Notes: Patient reports that 4 days ago she had blood bowel movement. She states that the blood was bright red. She states that the bloody bowel movements were present for 2 days and have resolved and that she has not had any further blood in her stool for the last 2 days. She denies any current symptom. Patient would like to establish as a new patient. Appointment made with the patient's preferred provider. Patient instructed to call back for new or worsening symptoms. Patient verbalized understanding and agreement with this plan. She reports if her symptoms return prior to her appointment she will go to the ED for evaluation.      Reason for Disposition  Rectal bleeding is minimal (e.g., blood just on toilet paper, a few drops in toilet bowl)  Answer Assessment - Initial Assessment Questions 1. APPEARANCE of BLOOD: "What color is it?" "Is it passed separately, on the surface of the stool, or mixed in with the stool?"      Bright red 2. AMOUNT: "How much blood was passed?"      Small amount 3. FREQUENCY: "How many times has blood been passed with the stools?"      Intermittent  4. ONSET: "When was the blood first seen in the stools?" (Days or weeks)      4 days ago, no blood for the last 2 days  5. DIARRHEA: "Is there also some diarrhea?" If Yes, ask: "How many  diarrhea stools in the past 24 hours?"      Yes 6. CONSTIPATION: "Do you have constipation?" If Yes, ask: "How bad is it?"     No 7. RECURRENT SYMPTOMS: "Have you had blood in your stools before?" If Yes, ask: "When was the last time?" and "What happened that time?"      No 8. BLOOD THINNERS: "Do you take any blood thinners?" (e.g., Coumadin/warfarin, Pradaxa/dabigatran, aspirin)     No 9. OTHER SYMPTOMS: "Do you have any other symptoms?"  (e.g., abdomen pain, vomiting, dizziness, fever)     No  Protocols used: Rectal Bleeding-A-AH

## 2024-03-23 ENCOUNTER — Encounter: Payer: Self-pay | Admitting: Nurse Practitioner

## 2024-03-23 ENCOUNTER — Ambulatory Visit (INDEPENDENT_AMBULATORY_CARE_PROVIDER_SITE_OTHER): Payer: Self-pay | Admitting: Nurse Practitioner

## 2024-03-23 VITALS — BP 120/70 | HR 85 | Temp 98.5°F | Ht 65.0 in | Wt 214.6 lb

## 2024-03-23 DIAGNOSIS — Z6835 Body mass index (BMI) 35.0-35.9, adult: Secondary | ICD-10-CM

## 2024-03-23 DIAGNOSIS — N951 Menopausal and female climacteric states: Secondary | ICD-10-CM

## 2024-03-23 DIAGNOSIS — Z2821 Immunization not carried out because of patient refusal: Secondary | ICD-10-CM

## 2024-03-23 DIAGNOSIS — D219 Benign neoplasm of connective and other soft tissue, unspecified: Secondary | ICD-10-CM | POA: Diagnosis not present

## 2024-03-23 DIAGNOSIS — E782 Mixed hyperlipidemia: Secondary | ICD-10-CM

## 2024-03-23 DIAGNOSIS — K921 Melena: Secondary | ICD-10-CM

## 2024-03-23 DIAGNOSIS — Z13228 Encounter for screening for other metabolic disorders: Secondary | ICD-10-CM

## 2024-03-23 DIAGNOSIS — I10 Essential (primary) hypertension: Secondary | ICD-10-CM | POA: Diagnosis not present

## 2024-03-23 DIAGNOSIS — F32A Depression, unspecified: Secondary | ICD-10-CM

## 2024-03-23 DIAGNOSIS — F419 Anxiety disorder, unspecified: Secondary | ICD-10-CM

## 2024-03-23 DIAGNOSIS — E66812 Obesity, class 2: Secondary | ICD-10-CM | POA: Insufficient documentation

## 2024-03-23 DIAGNOSIS — Z7689 Persons encountering health services in other specified circumstances: Secondary | ICD-10-CM

## 2024-03-23 NOTE — Progress Notes (Signed)
 Tammy Escobar, CMA,acting as a Neurosurgeon for Tammy Epley, FNP.,have documented all relevant documentation on the behalf of Tammy Epley, FNP,as directed by  Tammy Epley, FNP while in the presence of Tammy Epley, FNP.  Subjective:  Patient ID: Tammy Escobar , female    DOB: [birth year] , 48 y.o.   MRN: [id number]  No chief complaint on file.   HPI  Patient presents today to establish care.  She had been established with Dr. Delta Escobar before he relocated to [place]. 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 Married. One daughter - 37 - healthy.   PMH - anxiety related to the passing of her father - prostate cancer then bladder cancer (military related possibly), metastasized to colon passed in 2019. She was in grief counseling for "a while", occasionally will do a check in, she has not done a check in for about 3 years. She has been doing mostly well until recent cousins celebration for her grandmother who has also passed (bladder cancer and breast cancer, COPD- deceased). Depression also around the same.   2 brothers - one in [place]  and another in [place] Tammy Escobar) - alcohol and drug abuse.   She had a colonoscopy November 2022 - normal. She had blood in her stool that was in the toilet bright red blood in February. At the time she been exposed to Norovirus at her job. She has not had anything since.    LMP - April 1st.  Patient reports compliance with medication. Patient denies any chest pain, SOB, or headaches. Patient reports she had a stomach bug in February and noticed some blood in her stool 2 times when she had diarrhea.    Patient reports she hasn't been to the doctor in about 2 years.  Patient is established with [facility]. She believes her fibroids are getting bigger.   She is using a walking pad and a stand up desk. She does have some swelling in her legs when walking.     Past Medical History:  Diagnosis Date   Anxiety    Depression    High  cholesterol    Hypertension 2016   Leiomyoma      Family History  Problem Relation Age of Onset   Hypertension Mother    Rheum arthritis Mother    Thyroid disease Mother    Arthritis Mother    Varicose Veins Mother    Heart disease Father    Hypertension Father    Cancer Father 6       prostate,bladder   Kidney disease Father    Alcohol abuse Brother    Drug abuse Brother    Diabetes Maternal Aunt    Hypertension Maternal Aunt    Alcohol abuse Maternal Aunt    Diabetes Maternal Uncle    Hypertension Maternal Uncle    Alcohol abuse Maternal Uncle    Diabetes Paternal Aunt    Hypertension Paternal Aunt    Diabetes Paternal Uncle    Hypertension Paternal Uncle    Breast cancer Maternal Grandmother 52   Cancer Maternal Grandmother        BLADDER   Breast cancer Paternal Grandmother        ? age   Cancer Paternal Grandmother    Breast cancer Cousin    Diabetes Maternal Aunt    Diabetes Maternal Aunt    Hypertension Brother      Current Outpatient Medications:    clonazePAM (KLONOPIN) 0.5 MG tablet, TAKE 1 TABLET  BY MOUTH ONE TO TWO TIMES A DAY AS NEEDED FOR SEVERE ANXIETY (Patient not taking: Reported on 03/23/2024), Disp: , Rfl:    fluticasone (FLONASE) 50 MCG/ACT nasal spray, Place 1 spray into both nostrils 2 (two) times daily as needed for rhinitis. (Patient not taking: Reported on 03/23/2024), Disp: 18.2 mL, Rfl: 1   Multiple Vitamins-Minerals (MULTIVITAMIN WITH MINERALS) tablet, Take 1 tablet by mouth daily. (Patient not taking: Reported on 03/23/2024), Disp: , Rfl:    No Known Allergies   Review of Systems  Constitutional: Negative.  Negative for activity change and fatigue.  Eyes:  Negative for visual disturbance.  Respiratory: Negative.  Negative for choking, shortness of breath and wheezing.   Cardiovascular: Negative.  Negative for chest pain, palpitations and leg swelling.  Gastrointestinal: Negative.   Endocrine: Negative.  Negative for polydipsia, polyphagia  and polyuria.  Musculoskeletal: Negative.   Skin: Negative.   Neurological:  Negative for dizziness, weakness and headaches.  Psychiatric/Behavioral:  Negative for confusion. The patient is not nervous/anxious.      Today's Vitals   03/23/24 1507  BP: 120/70  Pulse: 85  Temp: 98.5 F (36.9 C)  TempSrc: Oral  Weight: 214 lb 9.6 oz (97.3 kg)  Height: 5\' 5"  (1.651 m)  PainSc: 0-No pain   Body mass index is 35.71 kg/m.  Wt Readings from Last 3 Encounters:  03/23/24 214 lb 9.6 oz (97.3 kg)  09/24/23 209 lb (94.8 kg)  09/13/22 204 lb (92.5 kg)     Objective:  Physical Exam Vitals and nursing note reviewed.  Constitutional:      General: She is not in acute distress.    Appearance: Normal appearance.  Cardiovascular:     Rate and Rhythm: Normal rate and regular rhythm.     Pulses: Normal pulses.     Heart sounds: Normal heart sounds. No murmur heard. Pulmonary:     Effort: Pulmonary effort is normal. No respiratory distress.     Breath sounds: Normal breath sounds. No wheezing.  Neurological:     General: No focal deficit present.     Mental Status: She is alert and oriented to person, place, and time.     Cranial Nerves: No cranial nerve deficit.     Motor: No weakness.  Psychiatric:        Mood and Affect: Mood normal. Affect is tearful (when talking about her father).        Behavior: Behavior normal.        Thought Content: Thought content normal.        Judgment: Judgment normal.        03/23/2024    3:20 PM  Depression screen PHQ 2/9  Decreased Interest 0  Down, Depressed, Hopeless 0  PHQ - 2 Score 0  Altered sleeping 2  Tired, decreased energy 1  Change in appetite 0  Feeling bad or failure about yourself  0  Trouble concentrating 0  Moving slowly or fidgety/restless 0  Suicidal thoughts 0  PHQ-9 Score 3  Difficult doing work/chores Somewhat difficult      03/23/2024    3:21 PM  GAD 7 : Generalized Anxiety Score  Nervous, Anxious, on Edge 1   Control/stop worrying 0  Worry too much - different things 0  Trouble relaxing 0  Restless 0  Easily annoyed or irritable 0  Afraid - awful might happen 0  Total GAD 7 Score 1  Anxiety Difficulty Not difficult at all  Assessment And Plan:  Establishing care with new doctor, encounter for Assessment & Plan: Patient is here to establish care. Went over patient medical, family, social and surgical history. Reviewed with patient their medications and any allergies  Reviewed with patient their sexual orientation, drug/tobacco and alcohol use Dicussed any new concerns with patient  recommended patient comes in for a physical exam and complete blood work.  Educated patient about the importance of annual screenings and immunizations.  Advised patient to eat a healthy diet along with exercise for atleast 30-45 min atleast 4-5 days of the week.     Mixed hyperlipidemia Assessment & Plan: Will check cholesterol levels, no current medications.   Orders: -     Lipid panel -     CMP14+EGFR  Perimenopause  Fibroids  Blood in stool Assessment & Plan: She is advised to continue to monitor may be related to having Norovirus.   Orders: -     CBC  Anxiety and depression Assessment & Plan: Depression screen score is 3 and anxiety screen score is 1 - this is mostly related to her father who has passed away.    COVID-19 vaccination declined Assessment & Plan: Declines covid 19 vaccine. Discussed risk of covid 84 and if she changes her mind about the vaccine to call the office. Education has been provided regarding the importance of this vaccine but patient still declined. Advised may receive this vaccine at local pharmacy or Health Dept.or vaccine clinic. Aware to provide a copy of the vaccination record if obtained from local pharmacy or Health Dept.  Encouraged to take multivitamin, vitamin d, vitamin c and zinc to increase immune system. Aware can call office if would like  to have vaccine here at office. Verbalized acceptance and understanding.    Class 2 obesity with body mass index (BMI) of 35.0 to 35.9 in adult, unspecified obesity type, unspecified whether serious comorbidity present Assessment & Plan: She is encouraged to strive for BMI less than 30 to decrease cardiac risk. Advised to aim for at least 150 minutes of exercise per week.    Well-controlled hypertension Assessment & Plan: Blood pressure is well controlled, continue current lifestyle changes to include low salt.   Orders: -     CMP14+EGFR  Encounter for screening for metabolic disorder -     Hemoglobin A1c   Return in about 4 months (around 07/23/2024) for phy when able..   Patient was given opportunity to ask questions. Patient verbalized understanding of the plan and was able to repeat key elements of the plan. All questions were answered to their satisfaction.    Inge Mangle, FNP, have reviewed all documentation for this visit. The documentation on 03/23/24 for the exam, diagnosis, procedures, and orders are all accurate and complete.   IF YOU HAVE BEEN REFERRED TO A SPECIALIST, IT MAY TAKE 1-2 WEEKS TO SCHEDULE/PROCESS THE REFERRAL. IF YOU HAVE NOT HEARD FROM US /SPECIALIST IN TWO WEEKS, PLEASE GIVE US  A CALL AT [phone].

## 2024-03-24 LAB — CMP14+EGFR
ALT: 17 IU/L (ref 0–32)
AST: 17 IU/L (ref 0–40)
Albumin: 4.5 g/dL (ref 3.9–4.9)
Alkaline Phosphatase: 52 IU/L (ref 44–121)
BUN/Creatinine Ratio: 16 (ref 9–23)
BUN: 14 mg/dL (ref 6–24)
Bilirubin Total: 0.7 mg/dL (ref 0.0–1.2)
CO2: 22 mmol/L (ref 20–29)
Calcium: 9.3 mg/dL (ref 8.7–10.2)
Chloride: 102 mmol/L (ref 96–106)
Creatinine, Ser: 0.89 mg/dL (ref 0.57–1.00)
Globulin, Total: 3.4 g/dL (ref 1.5–4.5)
Glucose: 81 mg/dL (ref 70–99)
Potassium: 4.5 mmol/L (ref 3.5–5.2)
Sodium: 138 mmol/L (ref 134–144)
Total Protein: 7.9 g/dL (ref 6.0–8.5)
eGFR: 80 mL/min/{1.73_m2} (ref >59)

## 2024-03-24 LAB — LIPID PANEL
Chol/HDL Ratio: 4.1 ratio (ref 0.0–4.4)
Cholesterol, Total: 199 mg/dL (ref 100–199)
HDL: 48 mg/dL (ref >39)
LDL Chol Calc (NIH): 127 mg/dL — ABNORMAL HIGH (ref 0–99)
Triglycerides: 135 mg/dL (ref 0–149)
VLDL Cholesterol Cal: 24 mg/dL (ref 5–40)

## 2024-03-24 LAB — CBC
Hematocrit: 37.1 % (ref 34.0–46.6)
Hemoglobin: 12.4 g/dL (ref 11.1–15.9)
MCH: 28.9 pg (ref 26.6–33.0)
MCHC: 33.4 g/dL (ref 31.5–35.7)
MCV: 87 fL (ref 79–97)
Platelets: 445 10*3/uL (ref 150–450)
RBC: 4.29 x10E6/uL (ref 3.77–5.28)
RDW: 13 % (ref 11.7–15.4)
WBC: 9.4 10*3/uL (ref 3.4–10.8)

## 2024-03-24 LAB — HEMOGLOBIN A1C
Est. average glucose Bld gHb Est-mCnc: 105 mg/dL
Hgb A1c MFr Bld: 5.3 % (ref 4.8–5.6)

## 2024-03-31 ENCOUNTER — Encounter: Payer: Self-pay | Admitting: Nurse Practitioner

## 2024-03-31 DIAGNOSIS — F32A Depression, unspecified: Secondary | ICD-10-CM | POA: Insufficient documentation

## 2024-03-31 DIAGNOSIS — K921 Melena: Secondary | ICD-10-CM | POA: Insufficient documentation

## 2024-03-31 NOTE — Assessment & Plan Note (Signed)
 She is advised to continue to monitor may be related to having Norovirus.

## 2024-03-31 NOTE — Assessment & Plan Note (Signed)
 Will check cholesterol levels, no current medications.

## 2024-03-31 NOTE — Assessment & Plan Note (Signed)

## 2024-03-31 NOTE — Assessment & Plan Note (Signed)
 Blood pressure is well controlled, continue current lifestyle changes to include low salt.

## 2024-03-31 NOTE — Assessment & Plan Note (Signed)
 Depression screen score is 3 and anxiety screen score is 1 - this is mostly related to her father who has passed away.

## 2024-03-31 NOTE — Assessment & Plan Note (Signed)

## 2024-03-31 NOTE — Assessment & Plan Note (Signed)
 She is encouraged to strive for BMI less than 30 to decrease cardiac risk. Advised to aim for at least 150 minutes of exercise per week.

## 2024-06-03 ENCOUNTER — Other Ambulatory Visit (INDEPENDENT_AMBULATORY_CARE_PROVIDER_SITE_OTHER): Payer: Self-pay

## 2024-06-03 DIAGNOSIS — K921 Melena: Secondary | ICD-10-CM | POA: Diagnosis not present

## 2024-06-03 LAB — HEMOCCULT GUIAC POC 1CARD (OFFICE)
Card #2 Fecal Occult Blod, POC: NEGATIVE
Card #3 Fecal Occult Blood, POC: NEGATIVE
Fecal Occult Blood, POC: NEGATIVE

## 2024-07-20 ENCOUNTER — Other Ambulatory Visit: Payer: Self-pay | Admitting: Nurse Practitioner

## 2024-07-20 DIAGNOSIS — Z1231 Encounter for screening mammogram for malignant neoplasm of breast: Secondary | ICD-10-CM

## 2024-07-22 ENCOUNTER — Encounter: Payer: Self-pay | Admitting: Nurse Practitioner

## 2024-07-22 ENCOUNTER — Ambulatory Visit: Admitting: Nurse Practitioner

## 2024-07-22 VITALS — BP 120/60 | HR 64 | Temp 98.4°F | Ht 65.0 in | Wt 216.0 lb

## 2024-07-22 DIAGNOSIS — Z0000 Encounter for general adult medical examination without abnormal findings: Secondary | ICD-10-CM

## 2024-07-22 DIAGNOSIS — F32A Depression, unspecified: Secondary | ICD-10-CM | POA: Diagnosis not present

## 2024-07-22 DIAGNOSIS — Z6835 Body mass index (BMI) 35.0-35.9, adult: Secondary | ICD-10-CM

## 2024-07-22 DIAGNOSIS — E66812 Obesity, class 2: Secondary | ICD-10-CM

## 2024-07-22 DIAGNOSIS — Z8261 Family history of arthritis: Secondary | ICD-10-CM

## 2024-07-22 DIAGNOSIS — I10 Essential (primary) hypertension: Secondary | ICD-10-CM | POA: Diagnosis not present

## 2024-07-22 DIAGNOSIS — E782 Mixed hyperlipidemia: Secondary | ICD-10-CM

## 2024-07-22 DIAGNOSIS — F419 Anxiety disorder, unspecified: Secondary | ICD-10-CM | POA: Diagnosis not present

## 2024-07-22 DIAGNOSIS — E559 Vitamin D deficiency, unspecified: Secondary | ICD-10-CM

## 2024-07-22 DIAGNOSIS — M2559 Pain in other specified joint: Secondary | ICD-10-CM

## 2024-07-22 DIAGNOSIS — Z13228 Encounter for screening for other metabolic disorders: Secondary | ICD-10-CM

## 2024-07-22 DIAGNOSIS — Z139 Encounter for screening, unspecified: Secondary | ICD-10-CM

## 2024-07-22 DIAGNOSIS — R82998 Other abnormal findings in urine: Secondary | ICD-10-CM

## 2024-07-22 LAB — POCT URINALYSIS DIP (CLINITEK)
Bilirubin, UA: NEGATIVE
Blood, UA: NEGATIVE
Glucose, UA: NEGATIVE mg/dL
Ketones, POC UA: NEGATIVE mg/dL
Nitrite, UA: NEGATIVE
POC PROTEIN,UA: NEGATIVE
Spec Grav, UA: <=1.005 — AB (ref 1.010–1.025)
Urobilinogen, UA: 0.2 U/dL
pH, UA: 6.5 (ref 5.0–8.0)

## 2024-07-22 MED ORDER — CLONAZEPAM 0.5 MG PO TABS: 0.5000 mg | ORAL_TABLET | Freq: Every day | ORAL | 0 refills | Status: AC | PRN

## 2024-07-22 NOTE — Progress Notes (Signed)
 Tammy Escobar, CMA,acting as a Neurosurgeon for Gaines Ada, FNP.,have documented all relevant documentation on the behalf of Gaines Ada, FNP,as directed by  Gaines Ada, FNP while in the presence of Gaines Ada, FNP.  Subjective:    Patient ID: Tammy Escobar , female    DOB: 1976/07/04 , 48 y.o.   MRN: [id number]  Chief Complaint  Patient presents with   Annual Exam    Patient presents today for HM, Patient reports compliance with medication. Patient denies any chest pain, SOB, or headaches. Patient has no concerns today.       HPI  Discussed the use of AI scribe software for clinical note transcription with the patient, who gave verbal consent to proceed.  History of Present Illness Tammy Escobar is a 48 year old female who presents for an annual physical exam.  She has not seen any other healthcare providers since her last visit but has scheduled a Pap smear and a mammogram. The mammogram is scheduled at the breast center, and the Pap smear will be done by her gynecologist at [facility].  She maintains a physically active lifestyle, engaging in activities such as walking with her husband and using an under-the-desk treadmill while working from home about three times a week. She also attempts to walk during her daughter's softball practice. She describes her activity level as having 'room for improvement.'  Her diet primarily consists of grilled chicken, shrimp, and salmon, with fruit as snacks. She has recently consumed more fried foods due to her brother's visit from [place]  but generally juices regularly and tries to limit fried foods.  She is preparing for a cruise from August 9th to 16th, visiting [place], [place], and [place], traveling with family and friends, including her brother and his family.  She experiences episodic anxiety symptoms, particularly around the anniversary of her father's passing in October, with symptoms  such as heart racing and throat tightness. She has a prescription for clonazepam , which she uses as needed, typically around this time of year. She is considering starting grief counseling again.  She reports intermittent swelling in her wrists and knuckles. Her mother has a history of rheumatoid arthritis. She has experienced low vitamin D  levels in the past.  No constipation, diarrhea, or urinary issues.   Past Medical History:  Diagnosis Date   Anxiety    Depression    High cholesterol    Hypertension 2016   Leiomyoma      Family History  Problem Relation Age of Onset   Hypertension Mother    Rheum arthritis Mother    Thyroid disease Mother    Arthritis Mother    Varicose Veins Mother    Heart disease Father    Hypertension Father    Cancer Father 82       prostate,bladder   Kidney disease Father    Alcohol abuse Brother    Drug abuse Brother    Diabetes Maternal Aunt    Hypertension Maternal Aunt    Alcohol abuse Maternal Aunt    Diabetes Maternal Uncle    Hypertension Maternal Uncle    Alcohol abuse Maternal Uncle    Diabetes Paternal Aunt    Hypertension Paternal Aunt    Diabetes Paternal Uncle    Hypertension Paternal Uncle    Breast cancer Maternal Grandmother 35   Cancer Maternal Grandmother        BLADDER   Breast cancer Paternal Grandmother        ?  age   Cancer Paternal Grandmother    Breast cancer Cousin    Diabetes Maternal Aunt    Diabetes Maternal Aunt    Hypertension Brother      Current Outpatient Medications:    clonazePAM  (KLONOPIN ) 0.5 MG tablet, Take 1 tablet (0.5 mg total) by mouth daily as needed for anxiety., Disp: 10 tablet, Rfl: 0   fluticasone  (FLONASE ) 50 MCG/ACT nasal spray, Place 1 spray into both nostrils 2 (two) times daily as needed for rhinitis. (Patient not taking: Reported on 07/22/2024), Disp: 18.2 mL, Rfl: 1   Multiple Vitamins-Minerals (MULTIVITAMIN WITH MINERALS) tablet, Take 1 tablet by mouth daily. (Patient not taking:  Reported on 07/22/2024), Disp: , Rfl:    No Known Allergies     The patient's tobacco use is:  Social History   Tobacco Use  Smoking Status Never  Smokeless Tobacco Never  . She has been exposed to passive smoke. The patient's alcohol use is:  Social History   Substance and Sexual Activity  Alcohol Use Not Currently   Comment: occasionally  Additional information: Last pap 08/13/2022, next one scheduled for 08/13/2025.    Review of Systems  Constitutional: Negative.  Negative for activity change and fatigue.  HENT: Negative.    Eyes: Negative.  Negative for visual disturbance.  Respiratory: Negative.  Negative for choking, shortness of breath and wheezing.   Cardiovascular: Negative.  Negative for chest pain, palpitations and leg swelling.  Gastrointestinal: Negative.   Endocrine: Negative.  Negative for polydipsia, polyphagia and polyuria.  Genitourinary: Negative.   Musculoskeletal: Negative.   Skin: Negative.   Allergic/Immunologic: Negative.   Neurological: Negative.  Negative for dizziness, weakness and headaches.  Hematological: Negative.   Psychiatric/Behavioral: Negative.  Negative for confusion. The patient is not nervous/anxious.      Today's Vitals   07/22/24 1513  BP: 120/60  Pulse: 64  Temp: 98.4 F (36.9 C)  TempSrc: Oral  Weight: 216 lb (98 kg)  Height: 5' 5 (1.651 m)  PainSc: 0-No pain   Body mass index is 35.94 kg/m.  Wt Readings from Last 3 Encounters:  07/22/24 216 lb (98 kg)  03/23/24 214 lb 9.6 oz (97.3 kg)  09/24/23 209 lb (94.8 kg)     Objective:  Physical Exam Vitals and nursing note reviewed.  Constitutional:      General: She is not in acute distress.    Appearance: Normal appearance. She is well-developed. She is obese.  HENT:     Head: Normocephalic and atraumatic.     Right Ear: Hearing, tympanic membrane, ear canal and external ear normal. There is no impacted cerumen.     Left Ear: Hearing, tympanic membrane, ear canal and  external ear normal. There is no impacted cerumen.     Nose: Nose normal.     Mouth/Throat:     Mouth: Mucous membranes are moist.  Eyes:     General: Lids are normal.     Extraocular Movements: Extraocular movements intact.     Conjunctiva/sclera: Conjunctivae normal.     Pupils: Pupils are equal, round, and reactive to light.     Funduscopic exam:    Right eye: No papilledema.        Left eye: No papilledema.  Neck:     Thyroid: No thyroid mass.     Vascular: No carotid bruit.  Cardiovascular:     Rate and Rhythm: Normal rate and regular rhythm.     Pulses: Normal pulses.     Heart sounds: Normal  heart sounds. No murmur heard. Pulmonary:     Effort: Pulmonary effort is normal. No respiratory distress.     Breath sounds: Normal breath sounds. No wheezing.  Chest:     Chest wall: No mass.  Breasts:    Tanner Score is 5.     Right: Normal. No mass or tenderness.     Left: Normal. No mass or tenderness.  Abdominal:     General: Abdomen is flat. Bowel sounds are normal. There is no distension.     Palpations: Abdomen is soft.     Tenderness: There is no abdominal tenderness.  Genitourinary:    Rectum: Guaiac result negative.  Musculoskeletal:        General: No swelling. Normal range of motion.     Cervical back: Full passive range of motion without pain, normal range of motion and neck supple.     Right lower leg: No edema.     Left lower leg: No edema.  Lymphadenopathy:     Upper Body:     Right upper body: No supraclavicular, axillary or pectoral adenopathy.     Left upper body: No supraclavicular, axillary or pectoral adenopathy.  Skin:    General: Skin is warm and dry.     Capillary Refill: Capillary refill takes less than 2 seconds.  Neurological:     General: No focal deficit present.     Mental Status: She is alert and oriented to person, place, and time.     Cranial Nerves: No cranial nerve deficit.     Sensory: No sensory deficit.  Psychiatric:        Mood  and Affect: Mood normal.        Behavior: Behavior normal.        Thought Content: Thought content normal.        Judgment: Judgment normal.      Assessment And Plan:     Encounter for annual health examination Assessment & Plan: Routine wellness visit. Discussed exercise routine and dietary habits. Upcoming cruise vacation noted. - Order labs: A1c, hemoglobin, cholesterol, thyroid function tests. - Encourage mask-wearing and hand hygiene during cruise.   Mixed hyperlipidemia Assessment & Plan: Recent increase in fried food intake discussed. - Order cholesterol panel. - Discuss dietary modifications to reduce fried food intake.  Orders: -     Lipid panel  Anxiety and depression Assessment & Plan: Anxiety related to grief during significant family anniversaries. Symptoms include heart racing and difficulty swallowing. Grief counseling considered. - Prescribe clonazepam  PRN with a few pills. - Discuss grief counseling options. - Advise follow-up if symptoms worsen or medication is needed more frequently.  Orders: -     clonazePAM ; Take 1 tablet (0.5 mg total) by mouth daily as needed for anxiety.  Dispense: 10 tablet; Refill: 0  Well-controlled hypertension Assessment & Plan: Blood pressure is well controlled, continue current lifestyle changes to include low salt.   Orders: -     EKG 12-Lead -     Microalbumin / creatinine urine ratio -     POCT URINALYSIS DIP (CLINITEK) -     CBC with Differential/Platelet -     CMP14+EGFR  Class 2 obesity with body mass index (BMI) of 35.0 to 35.9 in adult, unspecified obesity type, unspecified whether serious comorbidity present Assessment & Plan: Discussed need for increased physical activity and dietary changes. - Encourage increased physical activity. - Discuss dietary modifications to reduce fried food intake.   Encounter for screening for metabolic disorder -  Hemoglobin A1c  Vitamin D  deficiency Assessment &  Plan: Will check vitamin D  level and supplement as needed.    Also encouraged to spend 15 minutes in the sun daily.    Orders: -     VITAMIN D  25 Hydroxy (Vit-D Deficiency, Fractures)  Pain in other joint Assessment & Plan: Will check autoimmune panel  Orders: -     Autoimmune Profile -     Sedimentation rate  Family history of rheumatoid arthritis -     Autoimmune Profile -     Sedimentation rate -     VITAMIN D  25 Hydroxy (Vit-D Deficiency, Fractures)  Encounter for screening -     Hepatitis B surface antibody,qualitative  Leukocytes in urine Assessment & Plan: Leukocytes in urine will send urine culture  Orders: -     Urine Culture   Return for 1 year physical, 6 month bp check. Patient was given opportunity to ask questions. Patient verbalized understanding of the plan and was able to repeat key elements of the plan. All questions were answered to their satisfaction.   Gaines Ada, FNP  I, Gaines Ada, FNP, have reviewed all documentation for this visit. The documentation on 07/22/24 for the exam, diagnosis, procedures, and orders are all accurate and complete.

## 2024-07-23 LAB — CMP14+EGFR
ALT: 22 IU/L (ref 0–32)
AST: 26 IU/L (ref 0–40)
Albumin: 4.2 g/dL (ref 3.9–4.9)
Alkaline Phosphatase: 49 IU/L (ref 44–121)
BUN/Creatinine Ratio: 9 (ref 9–23)
BUN: 9 mg/dL (ref 6–24)
Bilirubin Total: 0.7 mg/dL (ref 0.0–1.2)
CO2: 22 mmol/L (ref 20–29)
Calcium: 9.3 mg/dL (ref 8.7–10.2)
Chloride: 102 mmol/L (ref 96–106)
Creatinine, Ser: 0.98 mg/dL (ref 0.57–1.00)
Globulin, Total: 3.2 g/dL (ref 1.5–4.5)
Glucose: 86 mg/dL (ref 70–99)
Potassium: 4.8 mmol/L (ref 3.5–5.2)
Sodium: 139 mmol/L (ref 134–144)
Total Protein: 7.4 g/dL (ref 6.0–8.5)
eGFR: 71 mL/min/1.73 (ref >59)

## 2024-07-23 LAB — LIPID PANEL
Chol/HDL Ratio: 4.6 ratio — ABNORMAL HIGH (ref 0.0–4.4)
Cholesterol, Total: 180 mg/dL (ref 100–199)
HDL: 39 mg/dL — ABNORMAL LOW (ref >39)
LDL Chol Calc (NIH): 118 mg/dL — ABNORMAL HIGH (ref 0–99)
Triglycerides: 129 mg/dL (ref 0–149)
VLDL Cholesterol Cal: 23 mg/dL (ref 5–40)

## 2024-07-23 LAB — AUTOIMMUNE PROFILE
Anti Nuclear Antibody (ANA): NEGATIVE
Complement C3, Serum: 157 mg/dL (ref 82–167)
dsDNA Ab: <1 [IU]/mL (ref 0–9)

## 2024-07-23 LAB — CBC WITH DIFFERENTIAL/PLATELET
Basophils Absolute: 0 x10E3/uL (ref 0.0–0.2)
Basos: 0 %
EOS (ABSOLUTE): 0.2 x10E3/uL (ref 0.0–0.4)
Eos: 2 %
Hematocrit: 35 % (ref 34.0–46.6)
Hemoglobin: 11.4 g/dL (ref 11.1–15.9)
Immature Grans (Abs): 0.1 x10E3/uL (ref 0.0–0.1)
Immature Granulocytes: 1 %
Lymphocytes Absolute: 2 x10E3/uL (ref 0.7–3.1)
Lymphs: 22 %
MCH: 28.1 pg (ref 26.6–33.0)
MCHC: 32.6 g/dL (ref 31.5–35.7)
MCV: 86 fL (ref 79–97)
Monocytes Absolute: 0.5 x10E3/uL (ref 0.1–0.9)
Monocytes: 5 %
Neutrophils Absolute: 6.5 x10E3/uL (ref 1.4–7.0)
Neutrophils: 70 %
Platelets: 447 x10E3/uL (ref 150–450)
RBC: 4.05 x10E6/uL (ref 3.77–5.28)
RDW: 13.3 % (ref 11.7–15.4)
WBC: 9.2 x10E3/uL (ref 3.4–10.8)

## 2024-07-23 LAB — HEPATITIS B SURFACE ANTIBODY,QUALITATIVE: Hep B Surface Ab, Qual: REACTIVE

## 2024-07-23 LAB — SEDIMENTATION RATE: Sed Rate: 20 mm/h (ref 0–32)

## 2024-07-23 LAB — VITAMIN D 25 HYDROXY (VIT D DEFICIENCY, FRACTURES): Vit D, 25-Hydroxy: 24.7 ng/mL — ABNORMAL LOW (ref 30.0–100.0)

## 2024-07-23 LAB — HEMOGLOBIN A1C
Est. average glucose Bld gHb Est-mCnc: 105 mg/dL
Hgb A1c MFr Bld: 5.3 % (ref 4.8–5.6)

## 2024-07-23 LAB — MICROALBUMIN / CREATININE URINE RATIO
Creatinine, Urine: 37.5 mg/dL
Microalb/Creat Ratio: <8 mg/g{creat} (ref 0–29)
Microalbumin, Urine: <3 ug/mL

## 2024-07-24 LAB — URINE CULTURE

## 2024-08-09 ENCOUNTER — Ambulatory Visit: Payer: Self-pay | Admitting: Nurse Practitioner

## 2024-08-09 DIAGNOSIS — R82998 Other abnormal findings in urine: Secondary | ICD-10-CM | POA: Insufficient documentation

## 2024-08-09 DIAGNOSIS — Z0000 Encounter for general adult medical examination without abnormal findings: Secondary | ICD-10-CM | POA: Insufficient documentation

## 2024-08-09 DIAGNOSIS — E559 Vitamin D deficiency, unspecified: Secondary | ICD-10-CM | POA: Insufficient documentation

## 2024-08-09 DIAGNOSIS — M2550 Pain in unspecified joint: Secondary | ICD-10-CM | POA: Insufficient documentation

## 2024-08-09 NOTE — Assessment & Plan Note (Signed)
 Leukocytes in urine will send urine culture

## 2024-08-09 NOTE — Assessment & Plan Note (Signed)
 Recent increase in fried food intake discussed. - Order cholesterol panel. - Discuss dietary modifications to reduce fried food intake.

## 2024-08-09 NOTE — Assessment & Plan Note (Signed)
 Discussed need for increased physical activity and dietary changes. - Encourage increased physical activity. - Discuss dietary modifications to reduce fried food intake.

## 2024-08-09 NOTE — Assessment & Plan Note (Signed)
 Blood pressure is well controlled, continue current lifestyle changes to include low salt.

## 2024-08-09 NOTE — Assessment & Plan Note (Signed)
 Will check vitamin D  level and supplement as needed.    Also encouraged to spend 15 minutes in the sun daily.

## 2024-08-09 NOTE — Assessment & Plan Note (Signed)
 Anxiety related to grief during significant family anniversaries. Symptoms include heart racing and difficulty swallowing. Grief counseling considered. - Prescribe clonazepam  PRN with a few pills. - Discuss grief counseling options. - Advise follow-up if symptoms worsen or medication is needed more frequently.

## 2024-08-09 NOTE — Assessment & Plan Note (Signed)
 Routine wellness visit. Discussed exercise routine and dietary habits. Upcoming cruise vacation noted. - Order labs: A1c, hemoglobin, cholesterol, thyroid function tests. - Encourage mask-wearing and hand hygiene during cruise.

## 2024-08-09 NOTE — Assessment & Plan Note (Signed)
Will check autoimmune panel

## 2024-09-17 ENCOUNTER — Other Ambulatory Visit: Payer: Self-pay | Admitting: Medical Genetics

## 2024-09-17 ENCOUNTER — Ambulatory Visit
Admission: RE | Admit: 2024-09-17 | Discharge: 2024-09-17 | Disposition: A | Discharge status: Home / Self Care | Source: Ambulatory Visit | Attending: Nurse Practitioner | Admitting: Nurse Practitioner

## 2024-09-17 DIAGNOSIS — Z1231 Encounter for screening mammogram for malignant neoplasm of breast: Secondary | ICD-10-CM

## 2024-09-24 ENCOUNTER — Encounter: Payer: Self-pay | Admitting: Obstetrics and Gynecology

## 2024-09-24 ENCOUNTER — Ambulatory Visit (INDEPENDENT_AMBULATORY_CARE_PROVIDER_SITE_OTHER): Admitting: Obstetrics and Gynecology

## 2024-09-24 VITALS — BP 104/60 | HR 80 | Temp 98.3°F | Ht 65.75 in | Wt 214.0 lb

## 2024-09-24 DIAGNOSIS — Z1331 Encounter for screening for depression: Secondary | ICD-10-CM

## 2024-09-24 DIAGNOSIS — D219 Benign neoplasm of connective and other soft tissue, unspecified: Secondary | ICD-10-CM | POA: Diagnosis not present

## 2024-09-24 DIAGNOSIS — Z01419 Encounter for gynecological examination (general) (routine) without abnormal findings: Secondary | ICD-10-CM | POA: Diagnosis not present

## 2024-09-24 NOTE — Progress Notes (Addendum)
 48 y.o. G77P1001 female with fibroids here for annual exam. Married. Works from home. 14yo daughter, very active at school. PCP: Georgina Speaks, FNP  She is concerned with menstrual cycles have become more heavier on CD1-2. Lots of fatigue on those days. 07/22/24 Hgb wnl  Patient's last menstrual period was 08/29/2024 (exact date). Period Duration (Days): 6 Period Pattern: Regular Menstrual Flow: Moderate Menstrual Control: Maxi pad Dysmenorrhea: (!) Moderate  Abnormal bleeding: none Pelvic discharge or pain: none Breast mass, nipple discharge or skin changes : none Birth control: none Last PAP:     Component Value Date/Time   DIAGPAP  09/13/2022 1503    - Negative for intraepithelial lesion or malignancy (NILM)   HPVHIGH Negative 09/13/2022 1503   ADEQPAP  09/13/2022 1503    Satisfactory for evaluation; transformation zone component PRESENT.   MMG: 09/17/24 density b, birads 1 neg  Colonoscopy: Nov 2022, q89yr Sexually active: yes  Exercising: Not currently, using walking pad at home Tobacco Use: Low Risk  (09/24/2024)   Patient History    Smoking Tobacco Use: Never    Smokeless Tobacco Use: Never    Passive Exposure: Not on file    Flowsheet Row Office Visit from 09/24/2024 in [facility]  PHQ-2 Total Score 1      09/24/2024   11:42 AM 07/22/2024    3:33 PM 03/23/2024    3:20 PM  PHQ9 SCORE ONLY  PHQ-9 Total Score 1 6  3       Data saved with a previous flowsheet row definition   GYN HISTORY: Fibroids  OB History  Gravida Para Term Preterm AB Living  1 1 1   1   SAB IAB Ectopic Multiple Live Births      1    # Outcome Date GA Lbr Len/2nd Weight Sex Type Anes PTL Lv  1 Term     F Vag-Spont   LIV    Past Medical History:  Diagnosis Date   Anxiety    Depression    High cholesterol    Hypertension 2016   Leiomyoma     Past Surgical History:  Procedure Laterality Date   VAGINAL DELIVERY  2011   WISDOM TOOTH EXTRACTION       Current Outpatient Medications on File Prior to Visit  Medication Sig Dispense Refill   clonazePAM  (KLONOPIN ) 0.5 MG tablet Take 1 tablet (0.5 mg total) by mouth daily as needed for anxiety. 10 tablet 0   Multiple Vitamins-Minerals (MULTIVITAMIN WITH MINERALS) tablet Take 1 tablet by mouth daily.     No current facility-administered medications on file prior to visit.    Social History   Socioeconomic History   Marital status: Married    Spouse name: Alm   Number of children: 1   Years of education: Not on file   Highest education level: Master's degree (e.g., MA, MS, MEng, MEd, MSW, MBA)  Occupational History   Occupation: Audiological scientist: [employer]   Occupation: Doctor, general practice  Tobacco Use   Smoking status: Never   Smokeless tobacco: Never  Vaping Use   Vaping status: Never Used  Substance and Sexual Activity   Alcohol use: Not Currently    Comment: occasionally   Drug use: No   Sexual activity: Yes    Partners: Male    Birth control/protection: None    Comment: First IC >16y/o, >5 Partners  Other Topics Concern   Not on file  Social History Narrative   Lives  with her husband and their daughter.   Social Drivers of Corporate investment banker Strain: Low Risk  (07/19/2024)   Overall Financial Resource Strain (CARDIA)    Difficulty of Paying Living Expenses: Not hard at all  Food Insecurity: No Food Insecurity (07/19/2024)   Hunger Vital Sign    Worried About Running Out of Food in the Last Year: Never true    Ran Out of Food in the Last Year: Never true  Transportation Needs: No Transportation Needs (07/19/2024)   PRAPARE - Administrator, Civil Service (Medical): No    Lack of Transportation (Non-Medical): No  Physical Activity: Insufficiently Active (07/19/2024)   Exercise Vital Sign    Days of Exercise per Week: 3 days    Minutes of Exercise per Session: 30 min  Stress: Stress Concern Present (07/19/2024)   [employer] of  Occupational Health - Occupational Stress Questionnaire    Feeling of Stress: To some extent  Social Connections: Socially Integrated (07/19/2024)   Social Connection and Isolation Panel    Frequency of Communication with Friends and Family: More than three times a week    Frequency of Social Gatherings with Friends and Family: Once a week    Attends Religious Services: 1 to 4 times per year    Active Member of [employer] or Organizations: Yes    Attends Engineer, structural: More than 4 times per year    Marital Status: Married  Catering manager Violence: Not on file    Family History  Problem Relation Age of Onset   Hypertension Mother    Rheum arthritis Mother    Thyroid disease Mother    Arthritis Mother    Varicose Veins Mother    Heart disease Father    Hypertension Father    Cancer Father 71       prostate,bladder   Kidney disease Father    Alcohol abuse Brother    Drug abuse Brother    Diabetes Maternal Aunt    Hypertension Maternal Aunt    Alcohol abuse Maternal Aunt    Diabetes Maternal Uncle    Hypertension Maternal Uncle    Alcohol abuse Maternal Uncle    Diabetes Paternal Aunt    Hypertension Paternal Aunt    Diabetes Paternal Uncle    Hypertension Paternal Uncle    Breast cancer Maternal Grandmother 46   Cancer Maternal Grandmother        BLADDER   Breast cancer Paternal Grandmother        ? age   Cancer Paternal Grandmother    Breast cancer Cousin    Diabetes Maternal Aunt    Diabetes Maternal Aunt    Hypertension Brother     No Known Allergies    PE Today's Vitals   09/24/24 1134  BP: 104/60  Pulse: 80  Temp: 98.3 F (36.8 C)  TempSrc: Oral  SpO2: 99%  Weight: 214 lb (97.1 kg)  Height: 5' 5.75 (1.67 m)   Body mass index is 34.8 kg/m.  Physical Exam Vitals reviewed. Exam conducted with a chaperone present.  Constitutional:      General: She is not in acute distress.    Appearance: Normal appearance.  HENT:     Head:  Normocephalic and atraumatic.     Nose: Nose normal.  Eyes:     Extraocular Movements: Extraocular movements intact.     Conjunctiva/sclera: Conjunctivae normal.  Neck:     Thyroid: No thyroid mass, thyromegaly or thyroid tenderness.  Pulmonary:     Effort: Pulmonary effort is normal.  Chest:     Chest wall: No mass or tenderness.  Breasts:    Right: Normal. No swelling, mass, nipple discharge, skin change or tenderness.     Left: Normal. No swelling, mass, nipple discharge, skin change or tenderness.  Abdominal:     General: There is no distension.     Palpations: Abdomen is soft.     Tenderness: There is no abdominal tenderness.  Genitourinary:    General: Normal vulva.     Exam position: Lithotomy position.     Urethra: No prolapse.     Vagina: Normal. No vaginal discharge or bleeding.     Cervix: Normal. No lesion.     Uterus: Normal. Not enlarged and not tender.      Adnexa: Right adnexa normal and left adnexa normal.     Comments: Uterus with globular shape Musculoskeletal:        General: Normal range of motion.     Cervical back: Normal range of motion.  Lymphadenopathy:     Upper Body:     Right upper body: No axillary adenopathy.     Left upper body: No axillary adenopathy.     Lower Body: No right inguinal adenopathy. No left inguinal adenopathy.  Skin:    General: Skin is warm and dry.  Neurological:     General: No focal deficit present.     Mental Status: She is alert.  Psychiatric:        Mood and Affect: Mood normal.        Behavior: Behavior normal.      Assessment and Plan:        Well woman exam with routine gynecological exam Assessment & Plan: Cervical cancer screening performed according to ASCCP guidelines. Encouraged annual mammogram screening Colonoscopy UTD DXA NA Labs and immunizations with her primary Encouraged safe sexual practices as indicated Encouraged healthy lifestyle practices with diet and exercise   Fibroids -     US   PELVIS TRANSVAGINAL NON-OB (TV ONLY); Future  Negative depression screening  Recommend ibuprofen 600-800mg  TID with meals x5d for AUB.   Vera LULLA Pa, MD

## 2024-09-24 NOTE — Patient Instructions (Addendum)
 Recommend ibuprofen  600-800mg  3 times a day with meals for 5 days for heavy menstrual bleeding and cramping.  Ideally, you should start this 1 to 2 days prior to your cycle.  Health Maintenance, Female Adopting a healthy lifestyle and getting preventive care are important in promoting health and wellness. Ask your health care provider about: The right schedule for you to have regular tests and exams. Things you can do on your own to prevent diseases and keep yourself healthy. What should I know about diet, weight, and exercise? Eat a healthy diet  Eat a diet that includes plenty of vegetables, fruits, low-fat dairy products, and lean protein. Do not eat a lot of foods that are high in solid fats, added sugars, or sodium. Maintain a healthy weight Body mass index (BMI) is used to identify weight problems. It estimates body fat based on height and weight. Your health care provider can help determine your BMI and help you achieve or maintain a healthy weight. Get regular exercise Get regular exercise. This is one of the most important things you can do for your health. Most adults should: Exercise for at least 150 minutes each week. The exercise should increase your heart rate and make you sweat (moderate-intensity exercise). Do strengthening exercises at least twice a week. This is in addition to the moderate-intensity exercise. Spend less time sitting. Even light physical activity can be beneficial. Watch cholesterol and blood lipids Have your blood tested for lipids and cholesterol at 48 years of age, then have this test every 5 years. Have your cholesterol levels checked more often if: Your lipid or cholesterol levels are high. You are older than 48 years of age. You are at high risk for heart disease. What should I know about cancer screening? Depending on your health history and family history, you may need to have cancer screening at various ages. This may include screening for: Breast  cancer. Cervical cancer. Colorectal cancer. Skin cancer. Lung cancer. What should I know about heart disease, diabetes, and high blood pressure? Blood pressure and heart disease High blood pressure causes heart disease and increases the risk of stroke. This is more likely to develop in people who have high blood pressure readings or are overweight. Have your blood pressure checked: Every 3-5 years if you are 14-50 years of age. Every year if you are 60 years old or older. Diabetes Have regular diabetes screenings. This checks your fasting blood sugar level. Have the screening done: Once every three years after age 66 if you are at a normal weight and have a low risk for diabetes. More often and at a younger age if you are overweight or have a high risk for diabetes. What should I know about preventing infection? Hepatitis B If you have a higher risk for hepatitis B, you should be screened for this virus. Talk with your health care provider to find out if you are at risk for hepatitis B infection. Hepatitis C Testing is recommended for: Everyone born from [birth year] through 1965. Anyone with known risk factors for hepatitis C. Sexually transmitted infections (STIs) Get screened for STIs, including gonorrhea and chlamydia, if: You are sexually active and are younger than 48 years of age. You are older than 48 years of age and your health care provider tells you that you are at risk for this type of infection. Your sexual activity has changed since you were last screened, and you are at increased risk for chlamydia or gonorrhea. Ask your health care  provider if you are at risk. Ask your health care provider about whether you are at high risk for HIV. Your health care provider may recommend a prescription medicine to help prevent HIV infection. If you choose to take medicine to prevent HIV, you should first get tested for HIV. You should then be tested every 3 months for as long as you are taking  the medicine. Pregnancy If you are about to stop having your period (premenopausal) and you may become pregnant, seek counseling before you get pregnant. Take 400 to 800 micrograms (mcg) of folic acid every day if you become pregnant. Ask for birth control (contraception) if you want to prevent pregnancy. Osteoporosis and menopause Osteoporosis is a disease in which the bones lose minerals and strength with aging. This can result in bone fractures. If you are 38 years old or older, or if you are at risk for osteoporosis and fractures, ask your health care provider if you should: Be screened for bone loss. Take a calcium or vitamin D supplement to lower your risk of fractures. Be given hormone replacement therapy (HRT) to treat symptoms of menopause. Follow these instructions at home: Alcohol use Do not drink alcohol if: Your health care provider tells you not to drink. You are pregnant, may be pregnant, or are planning to become pregnant. If you drink alcohol: Limit how much you have to: 0-1 drink a day. Know how much alcohol is in your drink. In the [place] one drink equals one 12 oz bottle of beer (355 mL), one 5 oz glass of wine (148 mL), or one 1 oz glass of hard liquor (44 mL). Lifestyle Do not use any products that contain nicotine or tobacco. These products include cigarettes, chewing tobacco, and vaping devices, such as e-cigarettes. If you need help quitting, ask your health care provider. Do not use street drugs. Do not share needles. Ask your health care provider for help if you need support or information about quitting drugs. General instructions Schedule regular health, dental, and eye exams. Stay current with your vaccines. Tell your health care provider if: You often feel depressed. You have ever been abused or do not feel safe at home. Summary Adopting a healthy lifestyle and getting preventive care are important in promoting health and wellness. Follow your health  care provider's instructions about healthy diet, exercising, and getting tested or screened for diseases. Follow your health care provider's instructions on monitoring your cholesterol and blood pressure. This information is not intended to replace advice given to you by your health care provider. Make sure you discuss any questions you have with your health care provider. Document Revised: 04/24/2021 Document Reviewed: 04/24/2021 Elsevier Patient Education  2024 [employer].

## 2024-09-24 NOTE — Assessment & Plan Note (Signed)
Cervical cancer screening performed according to ASCCP guidelines. Encouraged annual mammogram screening Colonoscopy UTD DXA NA Labs and immunizations with her primary Encouraged safe sexual practices as indicated Encouraged healthy lifestyle practices with diet and exercise

## 2024-10-22 ENCOUNTER — Ambulatory Visit (INDEPENDENT_AMBULATORY_CARE_PROVIDER_SITE_OTHER)

## 2024-10-22 ENCOUNTER — Ambulatory Visit (INDEPENDENT_AMBULATORY_CARE_PROVIDER_SITE_OTHER): Admitting: Obstetrics and Gynecology

## 2024-10-22 ENCOUNTER — Encounter: Payer: Self-pay | Admitting: Obstetrics and Gynecology

## 2024-10-22 VITALS — BP 118/76 | HR 77 | Temp 98.5°F | Ht 65.8 in | Wt 213.0 lb

## 2024-10-22 DIAGNOSIS — D219 Benign neoplasm of connective and other soft tissue, unspecified: Secondary | ICD-10-CM | POA: Diagnosis not present

## 2024-10-22 DIAGNOSIS — N920 Excessive and frequent menstruation with regular cycle: Secondary | ICD-10-CM | POA: Insufficient documentation

## 2024-10-22 NOTE — Assessment & Plan Note (Signed)
 Reviewed ultrasound revealing: 12 cm multifibroid uterus. 9 fibroids, the largest fibroid measuring 6 cm.   Fibroids have increased in both number and size since 2023 TVUS.  Discussed pathology of fibroids and benign nature. Reviewed that management of fibroids is dependent upon associated symptoms. Asymptomatic fibroids can be managed expectantly. However, fibroids can cause AUB and bulk symptoms, including dysmenorrhea, pelvic and lower back pain and pressure, dyspareunia, and constipation.  Reviewed hormonal and nonhormonal management, including the Mirena IUD and lysteda. Also reviewed interventional procedures like UAE and surgical management including myomectomy and hysterectomy.  She is considering UAE vs hysterectomy. She would be a candidate for [facility], BS as discussed today. She will call when decides how she would like to proceed. Continue PO Fe and NSAIDS with cycles

## 2024-10-22 NOTE — Progress Notes (Signed)
 48 y.o. G74P1001 female with fibroids, heavy menses here for TVUS. Married. Works from home. 14yo daughter, very active at school. PCP: Georgina Speaks, FNP  She is concerned with menstrual cycles have become more heavier on CD1-2. Lots of fatigue on those days. 07/22/24 Hgb wnl  Patient's last menstrual period was 09/27/2024 (exact date). Period Duration (Days): 7 Period Pattern: Regular Menstrual Flow: Moderate Menstrual Control: Maxi pad Dysmenorrhea: (!) Mild     Component Value Date/Time   DIAGPAP  09/13/2022 1503    - Negative for intraepithelial lesion or malignancy (NILM)   HPVHIGH Negative 09/13/2022 1503   ADEQPAP  09/13/2022 1503    Satisfactory for evaluation; transformation zone component PRESENT.    GYN HISTORY: Fibroids  OB History  Gravida Para Term Preterm AB Living  1 1 1   1   SAB IAB Ectopic Multiple Live Births      1    # Outcome Date GA Lbr Len/2nd Weight Sex Type Anes PTL Lv  1 Term     F Vag-Spont   LIV    Past Medical History:  Diagnosis Date   Anxiety    Depression    High cholesterol    Hypertension 2016   Leiomyoma     Past Surgical History:  Procedure Laterality Date   VAGINAL DELIVERY  2011   WISDOM TOOTH EXTRACTION      Current Outpatient Medications on File Prior to Visit  Medication Sig Dispense Refill   clonazePAM  (KLONOPIN ) 0.5 MG tablet Take 1 tablet (0.5 mg total) by mouth daily as needed for anxiety. 10 tablet 0   Multiple Vitamins-Minerals (MULTIVITAMIN WITH MINERALS) tablet Take 1 tablet by mouth daily.     No current facility-administered medications on file prior to visit.    No Known Allergies    PE Today's Vitals   10/22/24 1432  BP: 118/76  Pulse: 77  Temp: 98.5 F (36.9 C)  TempSrc: Oral  SpO2: 99%  Weight: 213 lb (96.6 kg)  Height: 5' 5.8 (1.671 m)   Body mass index is 34.59 kg/m.  Physical Exam Vitals reviewed.  Constitutional:      General: She is not in acute distress.    Appearance:  Normal appearance.  HENT:     Head: Normocephalic and atraumatic.     Nose: Nose normal.  Eyes:     Extraocular Movements: Extraocular movements intact.     Conjunctiva/sclera: Conjunctivae normal.  Pulmonary:     Effort: Pulmonary effort is normal.  Musculoskeletal:        General: Normal range of motion.     Cervical back: Normal range of motion.  Neurological:     General: No focal deficit present.     Mental Status: She is alert.  Psychiatric:        Mood and Affect: Mood normal.        Behavior: Behavior normal.     10/22/24 TVUS: Indications: Menorrhagia with regular cycles, known fibroids Comparison: 2023 TVUS   Findings:    Uterus: 12.0 x 8.6 x 8.3 cm, anteverted uterus.  Multiple fibroids noted Endometrial thickness: 11 mm. Fibroids: 1-5.7 x 3.5 cm, subserosal 2-2.3 x 1.9 cm, intramural 3-2.4 x 1.7 cm, intramural 4-2.1 x 1.5 cm, intramural 5-2.3 x 2.4 cm, intramural 6-1.1 x 0.9 cm, intramural 7-1.3 x 1.0 cm, intramural 8-1.4 x 1.0 cm, intramural 9-1.1 x 0.7 cm, intramural Left ovary: 3.4 x 2.2 x 2.3 cm, 18 mm follicle present. Right ovary: 2.7 x 1.7 x 1.9  cm, normal-appearing. No free fluid.   Impression:  12 cm multifibroid uterus.  The largest fibroid measuring 6 cm.  Fibroids have increased in both number and size since 2023 TVUS.   Vera LULLA Pa, MD    Assessment and Plan:        Fibroids Menorrhagia with regular cycle Assessment & Plan: Reviewed ultrasound revealing: 12 cm multifibroid uterus. 9 fibroids, the largest fibroid measuring 6 cm.   Fibroids have increased in both number and size since 2023 TVUS.  PAP UTD Hgb wnl  Discussed pathology of fibroids and benign nature. Reviewed that management of fibroids is dependent upon associated symptoms. Asymptomatic fibroids can be managed expectantly. However, fibroids can cause AUB and bulk symptoms, including dysmenorrhea, pelvic and lower back pain and pressure, dyspareunia, and  constipation.  Reviewed hormonal and nonhormonal management, including the Mirena IUD and lysteda. Also reviewed interventional procedures like UAE and surgical management including myomectomy and hysterectomy.  She is considering UAE vs hysterectomy. She would be a candidate for [facility], BS as discussed today. Discussed outpatient procedure. Reviewed that  recovery is usually 6 weeks with additional 4 weeks of pelvic rest. RShe will call when decides how she would like to proceed. Continue PO Fe and NSAIDS with cycles    Vera LULLA Pa, MD

## 2024-10-27 ENCOUNTER — Telehealth: Payer: Self-pay | Admitting: *Deleted

## 2024-10-27 DIAGNOSIS — N920 Excessive and frequent menstruation with regular cycle: Secondary | ICD-10-CM

## 2024-10-27 DIAGNOSIS — D219 Benign neoplasm of connective and other soft tissue, unspecified: Secondary | ICD-10-CM

## 2024-10-27 NOTE — Telephone Encounter (Signed)
 Patient left message requesting return call to discuss options from 10/22/24 OV.   Per review of 10/22/24 OV, UAE vs [facility], BS discussed, patient to return call to advise how she would like to proceed.   Call returned to patient, Left message to call Kate, RN at [place], [phone], option 5.

## 2024-10-28 NOTE — Telephone Encounter (Signed)
 Spoke with patient. Patient states she has decided to proceed with [facility], BS.   Advised I will forward request to Dr. Dallie. Once surgery referral is placed, please allow at least 2 weeks for benefits to be reviewed, I will be in contact to discuss surgery dates once this has been completed. Advised patient we are scheduling in 01/2025 currently. Patient not anticipating insurance changes for 2026.   Dr. Dallie -please place surgery referral.

## 2024-10-29 ENCOUNTER — Other Ambulatory Visit

## 2024-10-29 DIAGNOSIS — Z006 Encounter for examination for normal comparison and control in clinical research program: Secondary | ICD-10-CM

## 2024-11-04 NOTE — Telephone Encounter (Signed)
 Orders placed. Please offer patient surgical consult to discuss any additional questions prior to scheduling  Otherwise will need preop

## 2024-11-09 NOTE — Telephone Encounter (Signed)
 See Surgery referral.

## 2024-11-16 LAB — GENECONNECT MOLECULAR SCREEN: Genetic Analysis Overall Interpretation: NEGATIVE

## 2024-12-23 ENCOUNTER — Encounter: Payer: Self-pay | Admitting: Family Medicine

## 2024-12-23 ENCOUNTER — Telehealth: Payer: Self-pay

## 2024-12-23 ENCOUNTER — Ambulatory Visit: Payer: Self-pay | Admitting: Family Medicine

## 2024-12-23 VITALS — BP 124/80 | HR 83 | Temp 98.1°F | Ht 65.8 in | Wt 214.0 lb

## 2024-12-23 DIAGNOSIS — D219 Benign neoplasm of connective and other soft tissue, unspecified: Secondary | ICD-10-CM

## 2024-12-23 DIAGNOSIS — Z01818 Encounter for other preprocedural examination: Secondary | ICD-10-CM | POA: Diagnosis not present

## 2024-12-23 DIAGNOSIS — R9431 Abnormal electrocardiogram [ECG] [EKG]: Secondary | ICD-10-CM

## 2024-12-23 LAB — COMPREHENSIVE METABOLIC PANEL WITH GFR
ALT: 12 IU/L (ref 0–32)
AST: 17 IU/L (ref 0–40)
Albumin: 4.2 g/dL (ref 3.9–4.9)
Alkaline Phosphatase: 47 IU/L (ref 41–116)
BUN/Creatinine Ratio: 10 (ref 9–23)
BUN: 9 mg/dL (ref 6–24)
Bilirubin Total: 0.8 mg/dL (ref 0.0–1.2)
CO2: 21 mmol/L (ref 20–29)
Calcium: 9.2 mg/dL (ref 8.7–10.2)
Chloride: 104 mmol/L (ref 96–106)
Creatinine, Ser: 0.94 mg/dL (ref 0.57–1.00)
Globulin, Total: 2.8 g/dL (ref 1.5–4.5)
Glucose: 96 mg/dL (ref 70–99)
Potassium: 4.5 mmol/L (ref 3.5–5.2)
Sodium: 141 mmol/L (ref 134–144)
Total Protein: 7 g/dL (ref 6.0–8.5)
eGFR: 75 mL/min/1.73

## 2024-12-23 LAB — CBC WITH DIFFERENTIAL/PLATELET
Basophils Absolute: 0 x10E3/uL (ref 0.0–0.2)
Basos: 0 %
EOS (ABSOLUTE): 0.2 x10E3/uL (ref 0.0–0.4)
Eos: 2 %
Hematocrit: 34.8 % (ref 34.0–46.6)
Hemoglobin: 11 g/dL — ABNORMAL LOW (ref 11.1–15.9)
Immature Grans (Abs): 0 x10E3/uL (ref 0.0–0.1)
Immature Granulocytes: 0 %
Lymphocytes Absolute: 1.3 x10E3/uL (ref 0.7–3.1)
Lymphs: 18 %
MCH: 26.4 pg — ABNORMAL LOW (ref 26.6–33.0)
MCHC: 31.6 g/dL (ref 31.5–35.7)
MCV: 84 fL (ref 79–97)
Monocytes Absolute: 0.4 x10E3/uL (ref 0.1–0.9)
Monocytes: 5 %
Neutrophils Absolute: 5.4 x10E3/uL (ref 1.4–7.0)
Neutrophils: 75 %
Platelets: 409 x10E3/uL (ref 150–450)
RBC: 4.16 x10E6/uL (ref 3.77–5.28)
RDW: 14.5 % (ref 11.7–15.4)
WBC: 7.2 x10E3/uL (ref 3.4–10.8)

## 2024-12-23 NOTE — Telephone Encounter (Signed)
 Called [facility] and LVM. We need preop forms for patient.

## 2024-12-23 NOTE — Progress Notes (Signed)
 I,Jameka J Llittleton, CMA,acting as a neurosurgeon for [employer], NP.,have documented all relevant documentation on the behalf of Bruna Creighton, NP,as directed by  Bruna Creighton, NP while in the presence of Bruna Creighton, NP.  Subjective:  Patient ID: Tammy Escobar , female    DOB: [birth year] , 49 y.o.   MRN: [id number]  Chief Complaint  Patient presents with   Pre-op Exam    Patient presents today for a pre op exam. She is going to have a hysterectomy done.     HPI Discussed the use of AI scribe software for clinical note transcription with the patient, who gave verbal consent to proceed.  History of Present Illness   Tammy Escobar is a 49 year old female who presents for a preoperative visit for a hysterectomy.  She has experienced an increase in the number of uterine fibroids, from two to nine, leading to heavy menstrual flows and painful periods since at least November. She is awaiting surgical clearance to schedule the hysterectomy.  She has a daughter who will be fifteen this year and is not planning to have more children.  No chest pain or discomfort. She experiences shortness of breath only with significant physical exertion, such as repeated stair climbing, which she attributes to being out of shape. She works from home and regularly climbs sixteen to eighteen steps without significant issues. No history of cardiac problems.  She had an EKG in August and was unaware of any abnormal findings until now. She has not experienced any stress-related issues recently, except for work and holiday-related stress. She is a new patient as of April of the previous year, and her first EKG was in August.    Tammy Escobar is a 49 year old female who presents for a preoperative visit for a hysterectomy.  She has experienced an increase in the number of uterine fibroids, from two to nine, leading to heavy menstrual flows and painful periods since at least November. She is awaiting  surgical clearance to schedule the hysterectomy.  She has a daughter who will be fifteen this year and is not planning to have more children.  No chest pain or discomfort. She states that occasionally she experiences shortness of breath only with significant physical exertion, such as repeated stair climbing, which she attributes to being out of shape. She works from home and regularly climbs sixteen to eighteen steps without significant issues. No history of cardiac problems.   She had an EKG in August and was unaware of any abnormal findings until now. She has not experienced any stress-related issues recently, except for work and holiday-related stress. She is a new patient as of April of the previous year, and her first EKG was in August.   Advised patient that it will be best to be evaluated by cardiology, she voiced understanding and agreed.        Past Medical History:  Diagnosis Date   Anxiety    Depression    High cholesterol    Hypertension 2016   Leiomyoma      Family History  Problem Relation Age of Onset   Hypertension Mother    Rheum arthritis Mother    Thyroid disease Mother    Arthritis Mother    Varicose Veins Mother    Heart disease Father    Hypertension Father    Cancer Father 36       prostate,bladder   Kidney disease Father    Alcohol abuse Brother  Drug abuse Brother    Diabetes Maternal Aunt    Hypertension Maternal Aunt    Alcohol abuse Maternal Aunt    Diabetes Maternal Uncle    Hypertension Maternal Uncle    Alcohol abuse Maternal Uncle    Diabetes Paternal Aunt    Hypertension Paternal Aunt    Diabetes Paternal Uncle    Hypertension Paternal Uncle    Breast cancer Maternal Grandmother 20   Cancer Maternal Grandmother        BLADDER   Breast cancer Paternal Grandmother        ? age   Cancer Paternal Grandmother    Breast cancer Cousin    Diabetes Maternal Aunt    Diabetes Maternal Aunt    Hypertension Brother     Current  Medications[1]   Allergies[2]   Review of Systems  Constitutional: Negative.   HENT: Negative.    Respiratory:  Positive for shortness of breath.        Occasional shortness of breath  Cardiovascular: Negative.   Gastrointestinal: Negative.   Genitourinary: Negative.   Skin: Negative.   Neurological: Negative.   Psychiatric/Behavioral: Negative.       Today's Vitals   12/23/24 0910  BP: 124/80  Pulse: 83  Temp: 98.1 F (36.7 C)  TempSrc: Oral  Weight: 214 lb (97.1 kg)  Height: 5' 5.8 (1.671 m)  PainSc: 0-No pain   Body mass index is 34.75 kg/m.  Wt Readings from Last 3 Encounters:  12/25/24 215 lb (97.5 kg)  12/23/24 214 lb (97.1 kg)  10/22/24 213 lb (96.6 kg)    The 10-year ASCVD risk score (Arnett DK, et al., 2019) is: 2.4%   Values used to calculate the score:     Age: 67 years     Clinically relevant sex: Female     Is Non-Hispanic African American: Yes     Diabetic: No     Tobacco smoker: No     Systolic Blood Pressure: 128 mmHg     Is BP treated: No     HDL Cholesterol: 39 mg/dL     Total Cholesterol: 180 mg/dL  Objective:  Physical Exam Constitutional:      Appearance: Normal appearance.  HENT:     Head: Normocephalic.  Cardiovascular:     Rate and Rhythm: Normal rate and regular rhythm.     Pulses: Normal pulses.     Heart sounds: Normal heart sounds.  Pulmonary:     Effort: Pulmonary effort is normal.     Breath sounds: Normal breath sounds.  Abdominal:     General: Bowel sounds are normal.  Neurological:     Mental Status: She is alert.         Assessment And Plan:   Assessment & Plan Pre-op exam Scheduled for hysterectomy due to uterine fibroids. EKG showed sinus rhythm with possible pulmonary disease. No cardiac history. Referred to cardiology for further evaluation. - Ordered routine labs: PT, INR, urinalysis, CMP, CBC. - Referred to cardiology for further evaluation. Fibroids Multiple fibroids increased from two to nine,  causing heavy menstrual bleeding and dysmenorrhea. Decision made to proceed with hysterectomy due to fibroid growth and symptoms. - Proceed with hysterectomy as planned. Abnormal EKG Referred to cardiology       Return for keep next appt.  Patient was given opportunity to ask questions. Patient verbalized understanding of the plan and was able to repeat key elements of the plan. All questions were answered to their satisfaction.   Tammy Escobar  Petrina, NP, have reviewed all documentation for this visit. The documentation on 12/24/2024 for the exam, diagnosis, procedures, and orders are all accurate and complete.    IF YOU HAVE BEEN REFERRED TO A SPECIALIST, IT MAY TAKE 1-2 WEEKS TO SCHEDULE/PROCESS THE REFERRAL. IF YOU HAVE NOT HEARD FROM US /SPECIALIST IN TWO WEEKS, PLEASE GIVE US  A CALL AT [phone].       [1]  Current Outpatient Medications:    clonazePAM  (KLONOPIN ) 0.5 MG tablet, Take 1 tablet (0.5 mg total) by mouth daily as needed for anxiety., Disp: 10 tablet, Rfl: 0   Multiple Vitamins-Minerals (MULTIVITAMIN WITH MINERALS) tablet, Take 1 tablet by mouth daily., Disp: , Rfl:  [2] No Known Allergies

## 2024-12-24 ENCOUNTER — Other Ambulatory Visit

## 2024-12-24 NOTE — Assessment & Plan Note (Addendum)
 Multiple fibroids increased from two to nine, causing heavy menstrual bleeding and dysmenorrhea. Decision made to proceed with hysterectomy due to fibroid growth and symptoms. - Proceed with hysterectomy as planned.

## 2024-12-24 NOTE — Assessment & Plan Note (Addendum)
 Scheduled for hysterectomy due to uterine fibroids. EKG showed sinus rhythm with possible pulmonary disease. No cardiac history. Referred to cardiology for further evaluation. - Ordered routine labs: PT, INR, urinalysis, CMP, CBC. - Referred to cardiology for further evaluation.

## 2024-12-24 NOTE — Progress Notes (Signed)
 "    Cardiology Office Note   Date:  12/25/2024   ID:  Tammy Escobar, DOB 18-Jun-1976, MRN [id number]  PCP:  Georgina Speaks, FNP  Cardiologist:   None Referring:  Petrina Pries, NP   Chief Complaint  Patient presents with   Pre-op Exam      History of Present Illness: Tammy Escobar is a 49 y.o. female who presents for preop evaluation.  She was noted to have an abnormal EKG.  This demonstrated some nonspecific T wave changes.  There was borderline QT prolongation.  Repeated these T wave changes were not as prominent.  She has never had any cardiac history.  She does do some activities.  She exercises.  She walks on of treadmill types when she is working at her computer.  She might do this for 30 minutes to an hour a day.  Is not a brisk walk.  She goes up and down 16 stairs routinely. The patient denies any new symptoms such as chest discomfort, neck or arm discomfort. There has been no new shortness of breath, PND or orthopnea. There have been no reported palpitations, presyncope or syncope.   She is referred by Georgina Speaks, FNP.    Past Medical History:  Diagnosis Date   Anxiety    Depression    High cholesterol    Hypertension 2016   Leiomyoma     Past Surgical History:  Procedure Laterality Date   VAGINAL DELIVERY  2011   WISDOM TOOTH EXTRACTION       Current Outpatient Medications  Medication Sig Dispense Refill   clonazePAM  (KLONOPIN ) 0.5 MG tablet Take 1 tablet (0.5 mg total) by mouth daily as needed for anxiety. 10 tablet 0   Multiple Vitamins-Minerals (MULTIVITAMIN WITH MINERALS) tablet Take 1 tablet by mouth daily.     No current facility-administered medications for this visit.    Allergies:   Patient has no known allergies.    Social History:  The patient  reports that she has never smoked. She has never used smokeless tobacco. She reports that she does not currently use alcohol. She reports that she does not use drugs.   Family History:  The  patient's family history includes Alcohol abuse in her brother, maternal aunt, and maternal uncle; Arthritis in her mother; Breast cancer in her cousin and paternal grandmother; Breast cancer (age of onset: 73) in her maternal grandmother; Cancer in her maternal grandmother and paternal grandmother; Cancer (age of onset: 24) in her father; Diabetes in her maternal aunt, maternal aunt, maternal aunt, maternal uncle, paternal aunt, and paternal uncle; Drug abuse in her brother; Heart disease in her father; Hypertension in her brother, father, maternal aunt, maternal uncle, mother, paternal aunt, and paternal uncle; Kidney disease in her father; Rheum arthritis in her mother; Thyroid disease in her mother; Varicose Veins in her mother.    ROS:  Please see the history of present illness.   Otherwise, review of systems are positive for none.   All other systems are reviewed and negative.    PHYSICAL EXAM: VS:  BP 128/74 (BP Location: Right Arm, Patient Position: Sitting, Cuff Size: Large)   Pulse 92   Ht 5' 5 (1.651 m)   Wt 215 lb (97.5 kg)   SpO2 98%   BMI 35.78 kg/m  , BMI Body mass index is 35.78 kg/m. GENERAL:  Well appearing HEENT:  Pupils equal round and reactive, fundi not visualized, oral mucosa unremarkable NECK:  No jugular venous distention, waveform  within normal limits, carotid upstroke brisk and symmetric, no bruits, no thyromegaly LYMPHATICS:  No cervical, inguinal adenopathy LUNGS:  Clear to auscultation bilaterally BACK:  No CVA tenderness CHEST:  Unremarkable HEART:  PMI not displaced or sustained,S1 and S2 within normal limits, no S3, no S4, no clicks, no rubs, no murmurs ABD:  Flat, positive bowel sounds normal in frequency in pitch, no bruits, no rebound, no guarding, no midline pulsatile mass, no hepatomegaly, no splenomegaly EXT:  2 plus pulses throughout, no edema, no cyanosis no clubbing SKIN:  No rashes no nodules NEURO:  Cranial nerves II through XII grossly intact,  motor grossly intact throughout [facility]:  Cognitively intact, oriented to person place and time    EKG:      12/23/2024 sinus rhythm, rate 78, axis within normal limits, intervals within normal limits, no acute ST-T wave changes.    Recent Labs: 12/23/2024: ALT 12; BUN 9; Creatinine, Ser 0.94; Hemoglobin 11.0; Platelets 409; Potassium 4.5; Sodium 141    Lipid Panel    Component Value Date/Time   CHOL 180 07/22/2024 1621   TRIG 129 07/22/2024 1621   HDL 39 (L) 07/22/2024 1621   CHOLHDL 4.6 (H) 07/22/2024 1621   CHOLHDL 3.6 07/05/2014 1551   VLDL 21 07/05/2014 1551   LDLCALC 118 (H) 07/22/2024 1621      Wt Readings from Last 3 Encounters:  12/25/24 215 lb (97.5 kg)  12/23/24 214 lb (97.1 kg)  10/22/24 213 lb (96.6 kg)      Other studies Reviewed: Additional studies/ records that were reviewed today include: Labs. Review of the above records demonstrates:  Please see elsewhere in the note.     ASSESSMENT AND PLAN:   Preop: The patient has no high risk findings.  She has a high functional level.  She is not going for high risk surgery based on ACC/AHA guidelines.  Therefore, based on those guidelines no further cardiovascular testing is suggested and she is at acceptable risk for the planned procedure.  HTN: Her blood pressure is controlled.  Continue the meds as listed.  Dyslipidemia: Dyslipidemia we had a long conversation about this.  We had a discussion about exercise and diet.  She has a mildly elevated LDL but I do not think further statin therapy is indicated.  I might suggest a coronary calcium score when she is in her 34s given her family history.  Her dad did have heart disease in early her age so he died of prostate cancer.  Abnormal EKG: She had some nonspecific EKG changes.  She has no symptoms.  No change in therapy or further testing is indicated.  She needs primary risk reduction and we did discuss this at length.  Current medicines are reviewed at length with  the patient today.  The patient does not have concerns regarding medicines.  The following changes have been made:  no change  Labs/ tests ordered today include:  No orders of the defined types were placed in this encounter.    Disposition:   FU with me as needed.     Signed, Lynwood Schilling, MD  12/25/2024 2:29 PM    [facility]    "

## 2024-12-25 ENCOUNTER — Encounter: Payer: Self-pay | Admitting: Cardiology

## 2024-12-25 ENCOUNTER — Ambulatory Visit: Attending: Cardiology | Admitting: Cardiology

## 2024-12-25 VITALS — BP 128/74 | HR 92 | Ht 65.0 in | Wt 215.0 lb

## 2024-12-25 DIAGNOSIS — E785 Hyperlipidemia, unspecified: Secondary | ICD-10-CM | POA: Diagnosis not present

## 2024-12-25 DIAGNOSIS — Z01810 Encounter for preprocedural cardiovascular examination: Secondary | ICD-10-CM | POA: Diagnosis not present

## 2024-12-25 DIAGNOSIS — I10 Essential (primary) hypertension: Secondary | ICD-10-CM

## 2024-12-25 LAB — MICROSCOPIC EXAMINATION
Casts: NONE SEEN /LPF
Epithelial Cells (non renal): >10 /HPF — AB (ref 0–10)

## 2024-12-25 LAB — URINALYSIS, COMPLETE
Bilirubin, UA: NEGATIVE
Glucose, UA: NEGATIVE
Ketones, UA: NEGATIVE
Nitrite, UA: NEGATIVE
Specific Gravity, UA: 1.022 (ref 1.005–1.030)
Urobilinogen, Ur: 0.2 mg/dL (ref 0.2–1.0)
pH, UA: 5.5 (ref 5.0–7.5)

## 2024-12-25 LAB — PROTIME-INR
INR: 0.9 (ref 0.9–1.2)
Prothrombin Time: 10.2 s (ref 9.1–12.0)

## 2024-12-25 NOTE — Patient Instructions (Signed)

## 2025-01-06 ENCOUNTER — Ambulatory Visit: Payer: Self-pay | Admitting: Family Medicine

## 2025-01-06 DIAGNOSIS — R9431 Abnormal electrocardiogram [ECG] [EKG]: Secondary | ICD-10-CM | POA: Insufficient documentation

## 2025-01-06 NOTE — Progress Notes (Signed)
 Your clotting time is fine , blood count is good also. Have you seen the cardiologist?  Thanks!

## 2025-01-06 NOTE — Assessment & Plan Note (Signed)
 Referred to cardiology

## 2025-01-25 ENCOUNTER — Encounter: Admitting: Obstetrics and Gynecology

## 2025-01-26 ENCOUNTER — Ambulatory Visit: Payer: Self-pay | Admitting: Nurse Practitioner

## 2025-02-17 ENCOUNTER — Ambulatory Visit (HOSPITAL_COMMUNITY): Admit: 2025-02-17 | Admitting: Obstetrics and Gynecology

## 2025-02-17 SURGERY — HYSTERECTOMY, TOTAL, LAPAROSCOPIC, ROBOT-ASSISTED WITH SALPINGECTOMY
Anesthesia: General | Laterality: Bilateral

## 2025-07-27 ENCOUNTER — Encounter: Payer: Self-pay | Admitting: Nurse Practitioner

## 2025-09-29 ENCOUNTER — Ambulatory Visit: Admitting: Obstetrics and Gynecology
